# Patient Record
Sex: Male | Born: 1945 | Race: Black or African American | Hispanic: No | Marital: Married | Smoking: Former smoker
Health system: Southern US, Community
[De-identification: ages and names within clinical notes are randomized; demographics above are authoritative.]

## PROBLEM LIST (undated history)

## (undated) DIAGNOSIS — F32A Depression, unspecified: Secondary | ICD-10-CM

## (undated) DIAGNOSIS — N19 Unspecified kidney failure: Secondary | ICD-10-CM

## (undated) DIAGNOSIS — Z9889 Other specified postprocedural states: Secondary | ICD-10-CM

## (undated) DIAGNOSIS — I10 Essential (primary) hypertension: Secondary | ICD-10-CM

## (undated) DIAGNOSIS — O99342 Other mental disorders complicating pregnancy, second trimester: Secondary | ICD-10-CM

## (undated) DIAGNOSIS — I209 Angina pectoris, unspecified: Secondary | ICD-10-CM

## (undated) DIAGNOSIS — M1990 Unspecified osteoarthritis, unspecified site: Secondary | ICD-10-CM

## (undated) DIAGNOSIS — J189 Pneumonia, unspecified organism: Secondary | ICD-10-CM

## (undated) DIAGNOSIS — I499 Cardiac arrhythmia, unspecified: Secondary | ICD-10-CM

## (undated) DIAGNOSIS — I219 Acute myocardial infarction, unspecified: Secondary | ICD-10-CM

## (undated) DIAGNOSIS — R112 Nausea with vomiting, unspecified: Secondary | ICD-10-CM

## (undated) DIAGNOSIS — F329 Major depressive disorder, single episode, unspecified: Secondary | ICD-10-CM

## (undated) DIAGNOSIS — Z992 Dependence on renal dialysis: Secondary | ICD-10-CM

## (undated) DIAGNOSIS — G4730 Sleep apnea, unspecified: Secondary | ICD-10-CM

## (undated) DIAGNOSIS — M549 Dorsalgia, unspecified: Secondary | ICD-10-CM

## (undated) HISTORY — PX: COLONOSCOPY W/ BIOPSIES AND POLYPECTOMY: SHX1376

## (undated) HISTORY — PX: EYE SURGERY: SHX253

## (undated) HISTORY — PX: BACK SURGERY: SHX140

## (undated) HISTORY — PX: AV FISTULA PLACEMENT: SHX1204

---

## 1997-02-09 ENCOUNTER — Encounter: Payer: Self-pay | Admitting: Pulmonary Disease

## 1997-03-11 ENCOUNTER — Encounter: Payer: Self-pay | Admitting: Pulmonary Disease

## 1998-06-29 ENCOUNTER — Encounter (HOSPITAL_COMMUNITY): Admission: RE | Admit: 1998-06-29 | Discharge: 1998-09-27 | Payer: Self-pay | Admitting: Nephrology

## 1998-09-30 ENCOUNTER — Encounter (HOSPITAL_COMMUNITY): Admission: RE | Admit: 1998-09-30 | Discharge: 1998-12-29 | Payer: Self-pay | Admitting: Nephrology

## 1999-01-06 ENCOUNTER — Encounter (HOSPITAL_COMMUNITY): Admission: RE | Admit: 1999-01-06 | Discharge: 1999-04-06 | Payer: Self-pay | Admitting: Nephrology

## 1999-04-19 ENCOUNTER — Encounter (HOSPITAL_COMMUNITY): Admission: RE | Admit: 1999-04-19 | Discharge: 1999-07-18 | Payer: Self-pay | Admitting: Nephrology

## 1999-07-21 ENCOUNTER — Encounter: Admission: RE | Admit: 1999-07-21 | Discharge: 1999-07-21 | Payer: Self-pay | Admitting: Nephrology

## 1999-07-21 ENCOUNTER — Encounter: Payer: Self-pay | Admitting: Nephrology

## 1999-07-21 ENCOUNTER — Encounter (HOSPITAL_COMMUNITY): Admission: RE | Admit: 1999-07-21 | Discharge: 1999-10-19 | Payer: Self-pay | Admitting: Nephrology

## 1999-11-09 ENCOUNTER — Encounter (HOSPITAL_COMMUNITY): Admission: RE | Admit: 1999-11-09 | Discharge: 2000-02-07 | Payer: Self-pay | Admitting: Critical Care Medicine

## 2000-02-08 ENCOUNTER — Encounter (HOSPITAL_COMMUNITY): Admission: RE | Admit: 2000-02-08 | Discharge: 2000-05-08 | Payer: Self-pay | Admitting: Nephrology

## 2003-02-24 ENCOUNTER — Ambulatory Visit (HOSPITAL_COMMUNITY): Admission: RE | Admit: 2003-02-24 | Discharge: 2003-02-24 | Payer: Self-pay | Admitting: Orthopedic Surgery

## 2003-03-06 ENCOUNTER — Encounter: Admission: RE | Admit: 2003-03-06 | Discharge: 2003-06-04 | Payer: Self-pay | Admitting: Orthopedic Surgery

## 2003-06-05 ENCOUNTER — Encounter: Admission: RE | Admit: 2003-06-05 | Discharge: 2003-07-02 | Payer: Self-pay | Admitting: Orthopedic Surgery

## 2004-06-02 ENCOUNTER — Encounter (HOSPITAL_COMMUNITY): Admission: RE | Admit: 2004-06-02 | Discharge: 2004-08-31 | Payer: Self-pay | Admitting: Nephrology

## 2004-09-15 ENCOUNTER — Encounter (HOSPITAL_COMMUNITY): Admission: RE | Admit: 2004-09-15 | Discharge: 2004-12-14 | Payer: Self-pay | Admitting: Nephrology

## 2005-01-19 ENCOUNTER — Encounter (HOSPITAL_COMMUNITY): Admission: RE | Admit: 2005-01-19 | Discharge: 2005-04-19 | Payer: Self-pay | Admitting: Nephrology

## 2005-04-27 ENCOUNTER — Encounter (HOSPITAL_COMMUNITY): Admission: RE | Admit: 2005-04-27 | Discharge: 2005-07-26 | Payer: Self-pay | Admitting: Nephrology

## 2005-04-28 ENCOUNTER — Ambulatory Visit (HOSPITAL_COMMUNITY): Admission: RE | Admit: 2005-04-28 | Discharge: 2005-04-28 | Payer: Self-pay | Admitting: Vascular Surgery

## 2005-08-10 ENCOUNTER — Encounter (HOSPITAL_COMMUNITY): Admission: RE | Admit: 2005-08-10 | Discharge: 2005-11-08 | Payer: Self-pay | Admitting: Nephrology

## 2005-11-23 ENCOUNTER — Encounter (HOSPITAL_COMMUNITY): Admission: RE | Admit: 2005-11-23 | Discharge: 2006-02-21 | Payer: Self-pay | Admitting: Nephrology

## 2006-03-01 ENCOUNTER — Encounter (HOSPITAL_COMMUNITY): Admission: RE | Admit: 2006-03-01 | Discharge: 2006-05-30 | Payer: Self-pay | Admitting: Nephrology

## 2006-05-31 ENCOUNTER — Ambulatory Visit: Payer: Self-pay | Admitting: Vascular Surgery

## 2006-05-31 ENCOUNTER — Ambulatory Visit (HOSPITAL_COMMUNITY): Admission: RE | Admit: 2006-05-31 | Discharge: 2006-05-31 | Payer: Self-pay | Admitting: Vascular Surgery

## 2006-08-02 ENCOUNTER — Encounter: Admission: RE | Admit: 2006-08-02 | Discharge: 2006-08-02 | Payer: Self-pay | Admitting: Nephrology

## 2006-08-11 ENCOUNTER — Encounter: Admission: RE | Admit: 2006-08-11 | Discharge: 2006-08-11 | Payer: Self-pay | Admitting: Nephrology

## 2006-10-23 ENCOUNTER — Ambulatory Visit (HOSPITAL_COMMUNITY): Admission: RE | Admit: 2006-10-23 | Discharge: 2006-10-23 | Payer: Self-pay | Admitting: Vascular Surgery

## 2006-10-23 ENCOUNTER — Ambulatory Visit: Payer: Self-pay | Admitting: Vascular Surgery

## 2006-11-10 ENCOUNTER — Ambulatory Visit (HOSPITAL_COMMUNITY): Admission: RE | Admit: 2006-11-10 | Discharge: 2006-11-10 | Payer: Self-pay | Admitting: Nephrology

## 2006-11-24 ENCOUNTER — Ambulatory Visit: Payer: Self-pay | Admitting: Vascular Surgery

## 2006-12-11 ENCOUNTER — Ambulatory Visit: Payer: Self-pay | Admitting: Vascular Surgery

## 2006-12-11 ENCOUNTER — Ambulatory Visit (HOSPITAL_COMMUNITY): Admission: RE | Admit: 2006-12-11 | Discharge: 2006-12-11 | Payer: Self-pay | Admitting: Vascular Surgery

## 2006-12-13 ENCOUNTER — Ambulatory Visit: Payer: Self-pay | Admitting: Vascular Surgery

## 2007-02-12 ENCOUNTER — Ambulatory Visit (HOSPITAL_COMMUNITY): Admission: RE | Admit: 2007-02-12 | Discharge: 2007-02-12 | Payer: Self-pay | Admitting: Vascular Surgery

## 2007-02-12 ENCOUNTER — Ambulatory Visit: Payer: Self-pay | Admitting: *Deleted

## 2007-05-04 ENCOUNTER — Ambulatory Visit: Payer: Self-pay | Admitting: Vascular Surgery

## 2007-06-13 ENCOUNTER — Ambulatory Visit: Payer: Self-pay | Admitting: Pulmonary Disease

## 2007-06-13 DIAGNOSIS — I499 Cardiac arrhythmia, unspecified: Secondary | ICD-10-CM | POA: Insufficient documentation

## 2007-06-13 DIAGNOSIS — N186 End stage renal disease: Secondary | ICD-10-CM

## 2007-06-13 DIAGNOSIS — G4730 Sleep apnea, unspecified: Secondary | ICD-10-CM | POA: Insufficient documentation

## 2007-06-13 DIAGNOSIS — E119 Type 2 diabetes mellitus without complications: Secondary | ICD-10-CM | POA: Insufficient documentation

## 2007-06-13 DIAGNOSIS — I10 Essential (primary) hypertension: Secondary | ICD-10-CM | POA: Insufficient documentation

## 2007-06-13 DIAGNOSIS — E785 Hyperlipidemia, unspecified: Secondary | ICD-10-CM

## 2007-06-14 ENCOUNTER — Telehealth: Payer: Self-pay | Admitting: Pulmonary Disease

## 2007-08-16 ENCOUNTER — Telehealth (INDEPENDENT_AMBULATORY_CARE_PROVIDER_SITE_OTHER): Payer: Self-pay | Admitting: *Deleted

## 2007-08-23 ENCOUNTER — Encounter (INDEPENDENT_AMBULATORY_CARE_PROVIDER_SITE_OTHER): Payer: Self-pay | Admitting: *Deleted

## 2007-08-24 ENCOUNTER — Telehealth (INDEPENDENT_AMBULATORY_CARE_PROVIDER_SITE_OTHER): Payer: Self-pay | Admitting: *Deleted

## 2007-08-29 ENCOUNTER — Ambulatory Visit (HOSPITAL_COMMUNITY): Admission: RE | Admit: 2007-08-29 | Discharge: 2007-08-29 | Payer: Self-pay | Admitting: Nephrology

## 2010-04-11 ENCOUNTER — Encounter: Payer: Self-pay | Admitting: Vascular Surgery

## 2010-08-03 NOTE — Op Note (Signed)
NAME:  Jeffrey Manning, Jeffrey Manning              ACCOUNT NO.:  [id number]   MEDICAL RECORD NO.:  [id number]          PATIENT TYPE:  AMB   LOCATION:  SDS                          FACILITY:  [facility]   PHYSICIAN:  Larina Earthly, M.D.    DATE OF BIRTH:  [birth year]   DATE OF PROCEDURE:  DATE OF DISCHARGE:                               OPERATIVE REPORT   PREOPERATIVE DIAGNOSIS:  End-stage renal disease with poorly functioning  left upper arm arteriovenous fistula due to deep subcutaneous course of  the fistula.   POSTOPERATIVE DIAGNOSIS:  End-stage renal disease with poorly  functioning left upper arm arteriovenous fistula due to deep  subcutaneous course of the fistula.   PROCEDURE:  1. Revision with resection of underlying fat and fascia over the left      upper arm AV fistula.  2. Placement of left IJ Diatek catheter with ultrasound visualization.   SURGEON:  Larina Earthly, M.D.   ASSISTANT:  Nurse.   ANESTHESIA:  MAC.   COMPLICATIONS:  None.   DISPOSITION:  To recovery room with chest x-ray pending.   DESCRIPTION OF PROCEDURE:  The patient was taken to the operating room  and placed in the supine position where the area of the left arm was  prepped and draped in the usual sterile fashion using local anesthesia.   Two separate incisions were made just medial to the upper arm fistula,  and through these incisions of the subcutaneous tissue and fascia over  the large cephalic vein fistula were resected.  This was resected for an  approximately 3-cm area over the area of the fistula itself.  The wounds  were irrigated with saline and hemostasis obtained with electrocautery.  A 10 flat Jackson-Pratt drain was brought through a separate stab  incision and was placed in the subcutaneous tunnel over the cephalic  vein fistula.  This was secured with 3-0 nylon stitch.  The two  incisions used to resect the fat and fascia were closed with 3-0 Vicryl  in the subcutaneous and subcuticular tissue.   Benzoin and Steri-Strips  were applied.   Next, the right and left neck and chest were imaged with ultrasound.  The patient had a small and possibly occluded right internal jugular  vein.  The left jugular vein was widely patent.  For this reason,  decision was made to place a left Diatek catheter.  The patient was  placed into Trendelenburg position and the right and left neck and chest  prepped and draped in the usual sterile fashion using local anesthesia.  Using a finder needle, the left internal jugular vein was identified.  Next, using Seldinger technique, a guidewire was passed down through an  18-gauge needle, and this was confirmed with fluoroscopy.  This was  passed down into the vena cava.  A dilator and peel-away sheath was  passed over the guidewire, and a 32-cm Diatek catheter was passed  through the peel-away sheath which was removed.  The catheter was  brought through a subcutaneous tunnel through a separate stab incision,  and the two lumen ports were attached.  Both lumens  flushed and  aspirated easily and were locked with 1000 units per mL of heparin.  The  catheter was secured to the skin with 3-0 nylon stitch and the entry  sites closed for 4-0 subcuticular Vicryl stitch.  Sterile dressing was  applied.   The patient was taken to the recovery in stable condition.      Larina Earthly, M.D.  Electronically Signed     TFE/MEDQ  D:  12/11/2006  T:  12/11/2006  Job:  [id number]

## 2010-08-03 NOTE — Assessment & Plan Note (Signed)
OFFICE VISIT   Jeffrey Manning, Jeffrey Manning  DOB:  [birth year]                                       11/24/2006  HYQMV#:[id number]   OFFICE NOTE   The patient presents today for evaluation of AV access.  He is a 65-year-  old black male with end-stage renal disease.  He has a left upper arm AV  fistula creation by me in February of 2007.  He had a Diatek catheter  placed after an infiltration in March of 2008.  He recently had the  catheter removed.  He has undergone a shuntogram of his left upper arm  AV fistula, but is still having difficulty accessing this.   PHYSICAL EXAMINATION:  He does have a very well-developed large cephalic  vein fistula in his upper arm.  It is relatively superficial near the  antecubital space and this is the only area that the dialysis access  staff is able to access this fistula.  I imaged this with ultrasound and  reviewed his shuntogram.  He does have a few small insignificant side  branches off the cephalic vein fistula.  His issue is that the vein runs  below the fascia and that it is deep throughout the upper arm.  I  discussed options at length with the patient.  I explained one option  would be to abandon the fistula and placing a prosthetic graft.  Another  option would be to attempt to revive the fistula with mobilization and  removal of the subcutaneous fat and fascia above the vein.  I explained  that we would have to place a Diatek  catheter at the same setting to  allow this area to heal for approximately one month to six weeks and  then begin initiation of access of the fistula again.  He wishes to  proceed with this route.  I offered to proceed on 11/27/2006 and he  wishes  to defer this for two weeks.  We will proceed with the procedure on  12/11/2006.  He will notify us here if he develops access difficulty in  the interim.   Larina Earthly, M.D.  Electronically Signed   TFE/MEDQ  D:  11/24/2006  T:  11/27/2006   Job:  [id number]   cc:   Margaree Mackintosh, M.D.

## 2010-08-03 NOTE — Assessment & Plan Note (Signed)
OFFICE VISIT   TRACI, PLEMONS  DOB:  08-16-1945                                       05/04/2007  CHART#:[id number]   The patient presents today for evaluation of lower extremity  claudication.  He is known to me from prior AV access creation.  He is a  65 year old black male with end-stage renal disease.  He has been on  dialysis for over 2 years.  He reports numbness and burning in both legs  from his hips distally with walking.  He denies any resting symptoms.  Specifically, no problems with standing, squatting, or lying down, or  sitting.  He reports that the discomfort is relieved after several  minutes of rest.  He denies any rest pain in his lower extremities.   PAST MEDICAL HISTORY:  Significant for diabetes, hypertension.  He quit  smoking 15 years ago.  Does have end-stage renal disease.  He has a left  upper arm fistula for dialysis.   PHYSICAL EXAM:  Blood pressure 154/69, pulse 104, respirations 18, O2  saturation 97% on room air.  He does have 2+ radial pulses, 1 to 2+  femoral pulses.  I do not palpate pedal pulses bilaterally.   He underwent noninvasive vascular laboratory studies in our office.  This revealed slightly diminished pressure on the right at 0.89, normal  pressures on the left at 1.1.  He underwent a walking exercise study,  and this did not show any dramatic drop.  He did have a slight drop on  the left, no significant drop on the right.  I explained to the patient  that his symptoms certainly go along with lower extremity claudication-  type symptoms.  I have recommended CT angiogram for further evaluation  to determine if he does have significant iliac occlusive disease.  If  so, we would schedule him for formal arteriography and angioplasty.  I  will see him with his CT angiogram in 2 weeks when this is accomplished.   Larina Earthly, M.D.  Electronically Signed   TFE/MEDQ  D:  05/04/2007  T:  05/07/2007  Job:  [id number]

## 2010-08-03 NOTE — Assessment & Plan Note (Signed)
OFFICE VISIT   TRA, WILEMON  DOB:  21-Dec-1945                                       12/13/2006  CHART#:[id number]   Patient underwent revision of his left upper arm AV fistula on September  22.  He has a very well-developed upper arm fistula but was quite deep  down in his subcutaneous fat and fascia.  I mobilized this through two  separate incisions in his upper arm.  I did place a Jackson-Pratt drain,  which I have removed today.  He does have excellent initial result with  superficial lying vein that should be accessible for access in one  month.  He will continue the use of his hemodialysis catheter in the  interim.   Larina Earthly, M.D.  Electronically Signed   TFE/MEDQ  D:  12/13/2006  T:  12/14/2006  Job:  [id number]   cc:   Fayrene Fearing L. Deterding, M.D.

## 2010-08-06 NOTE — Op Note (Signed)
NAME:  Jeffrey Manning, Jeffrey Manning                        ACCOUNT NO.:  [id number]   MEDICAL RECORD NO.:  [id number]                   PATIENT TYPE:  AMB   LOCATION:  DAY                                  FACILITY:  [facility]   PHYSICIAN:  Almedia Balls. Ranell Patrick, M.D.              DATE OF BIRTH:  1945-12-08   DATE OF PROCEDURE:  02/24/2003  DATE OF DISCHARGE:                                 OPERATIVE REPORT   PREOPERATIVE DIAGNOSES:  Left shoulder partial-thickness rotator cuff tear,  chronic impingement syndrome, and acromioclavicular joint arthritis.   POSTOPERATIVE DIAGNOSES:  1. Left shoulder partial-thickness rotator cuff tear (near full-thickness).  2. Chronic impingement syndrome.  3. Acromioclavicular joint arthritis.   PROCEDURES PERFORMED:  1. Left shoulder arthroscopy with arthroscopic subacromial decompression.  2. Mini-open rotator cuff repair.  3. Open distal clavicle excision.   ATTENDING SURGEON:  Almedia Balls. Ranell Patrick, M.D.   FIRST ASSISTANT:  Clarene Reamer, P.A.-C.   SECOND ASSISTANT:  Nance Pew, P.A.-S.   ANESTHESIA:  General anesthesia with interscalene block anesthesia used.   ESTIMATED BLOOD LOSS:  Minimal   FLUIDS REPLACED:  1200 mL crystalloid.   Instrument counts were correct. There were no complications.  Preoperative  antibiotics were given.   INDICATIONS:  The patient is a 65 year old male who presents complaining of  left shoulder pain following an on-the-job injury.  The patient has had  extensive preoperative workup and testing and repeat clinical examination  with MRI scans demonstrating partial-thickness rotator cuff tear.  The  patient has failed conservative treatment regimen consisting of activity  modification, physical therapy, anti-inflammatory medications, and  injections, and presents now for operative treatment of suspected rotator  cuff tear and AC joint arthrosis.   After an adequate level of anesthesia achieved, patient positioned in the  modified beach chair position and the left shoulder was examined under  anesthesia.  Forward flexion to 140 degrees abduction, 90 degrees external  rotation, 30 internal rotation as outlined.  Negative sulcus.  Negative  anterior and posterior drawer.  After completion of exam under anesthesia,  the left shoulder was prepped and draped in its entirety in the usual  sterile fashion.  Diagnostic arthroscopy was carried out with standard  arthroscopic portals anteriorly and posteriorly.  These were created in a  similar fashion with infiltration of the skin with 0.25% Marcaine with  epinephrine followed by incision with 11 blade scalpel and introduction of  the cannula into the joint using blunt obturators.  Diagnostic arthroscopy  revealed a normal superior labrum and biceps anchor.  The biceps was normal.  Anterior inferior labrum was normal.  Subscapularis normal.  The rotator  cuff is torn in the region of the supraspinatus involving nearly the full  footprint with a [place] bit of tendon remaining. The teres minor and  infraspinatus were normal.  The posterior labrum was intact.  The  glenohumeral articular cartilage was fairly intact with some very  mild  chondromalacia anteriorly on the humeral head.  After this the scope was  placed in the subacromial space and a thorough bursectomy was performed  utilizing a full-radius resector followed by a very subtle acromioplasty.  The patient had a type 2 acromion but it was nice and smooth.  There were no  major spurs.  Just the very anterior lip was removed because the patient was  noted on preoperative MRI scan to have an os acromiale, which was a fairly  large one.  The CA ligament was also recessed but not completely removed.  Once the acromioplasty is performed, the scope is completed.  A small  incision was created overlying the junction between the anterior and lateral  heads of the deltoid for a mini-open repair.  This was taken down to  the  deltoid, which was split in line with its fibers and the raphe, exposing the  rotator cuff tear, which was easily identifiable.  A knife was used to  remove unhealthy-appearing rotator cuff tissue.  The footprint was freshened  up.  The remaining cuff looked outstanding.  Thus a single biocorkscrew  anchor 5-0 by Arthrex was utilized at the footprint, and it was repaired  with two Fibrewire sutures in a mattress fashion and a nice, smooth, low-  profile repair was obtained.  The shoulder was taken through a full range of  motion and no tension was noted.  The acromion was stable at the end of this  procedure.  After this a small sabre incision was created overlying the  distal clavicle at the [facility] joint just medial to that.  Dissection was carried  sharply down through the subcutaneous tissues.  The deltotrapezial fascia  was incised in line with the distal clavicle using a needle-tip Bovie.  Dissection was carried sharply down in the periosteal plane around the  distal end of the clavicle.  The distal 1 cm was excised using the  oscillating saw.  Thorough irrigation was performed to remove bony debris.  A small amount of bone wax at the distal end of the clavicle.  The  deltotrapezial fascia was repaired using #1 Vicryl suture in interrupted  figure-of-eight fashion with buried knots, followed by 2-0 Vicryl subcu and  4-0 running Monocryl for skin, and the other skin incisions as well as the  portals.  The patient was placed in a shoulder sling, having tolerated the  surgery well.                                               Almedia Balls. Ranell Patrick, M.D.    SRN/MEDQ  D:  02/24/2003  T:  02/25/2003  Job:  [id number]

## 2010-08-06 NOTE — Op Note (Signed)
[name], Jeffrey Manning              ACCOUNT NO.:  [id number]   MEDICAL RECORD NO.:  [id number]          PATIENT TYPE:  AMB   LOCATION:  SDS                          FACILITY:  [facility]   PHYSICIAN:  Larina Earthly, M.D.    DATE OF BIRTH:  04/04/1945   DATE OF PROCEDURE:  04/28/2005  DATE OF DISCHARGE:                                 OPERATIVE REPORT   PREOPERATIVE DIAGNOSIS:  Chronic renal insufficiency.   POSTOPERATIVE DIAGNOSIS:  Chronic renal insufficiency.   PROCEDURE:  Creation of left upper arm arteriovenous fistula.   SURGEON:  Larina Earthly, M.D.   ASSISTANT:  Coral Ceo, P.A.-C.   ANESTHESIA:  Monitored anesthesia care.   COMPLICATIONS:  None.   DISPOSITION:  To the recovery room stable.   PROCEDURE IN DETAIL:  The patient was taken to the operating room, placed in  the supine position, and the area of the left arm prepped and draped in the  usual sterile fashion.  Using local anesthesia, an incision was made over  the antecubital space and carried down to isolate the cephalic vein which  was of good caliber and the brachial artery which was also of good caliber.  The branches of the cephalic vein were ligated and divided.  The vein was  divided distally and mobilized to above the brachial artery.  The brachial  artery was occluded proximally and distally, opened with an 11 blade,  extended with Potts scissors.  The vein was spatulated and sewn end-to-side  to the artery with running 6-0 Prolene suture.  The clamps were removed and  an excellent thrill was noted.  The wound was irrigated with saline.  Hemostasis was obtained with cautery.  The wounds were closed with 3-0  Vicryl in the subcutaneous and subcuticular tissue.  Benzoin and Steri-  Strips were applied.      Larina Earthly, M.D.  Electronically Signed     TFE/MEDQ  D:  04/28/2005  T:  04/28/2005  Job:  [id number]

## 2010-08-06 NOTE — Op Note (Signed)
NAME:  PERNELL, LENOIR              ACCOUNT NO.:  [id number]   MEDICAL RECORD NO.:  [id number]          PATIENT TYPE:  AMB   LOCATION:  SDS                          FACILITY:  [facility]   PHYSICIAN:  Sanders E. Fields, MD  DATE OF BIRTH:  [birth year]   DATE OF PROCEDURE:  05/31/2006  DATE OF DISCHARGE:  05/31/2006                               OPERATIVE REPORT   PROCEDURES:  1. Insertion of right internal jugular vein Diatek catheter.  2. Head and neck ultrasound.   PREOPERATIVE DIAGNOSIS:  End-stage renal disease.   POSTOPERATIVE DIAGNOSIS:  End-stage renal disease.   ANESTHESIA:  Local with IV sedation.   INDICATIONS:  The patient has an existing left brachiocephalic fistula  which had a infiltration and could not currently be cannulated.  He  needs long-term dialysis access in the meanwhile.   OPERATIVE FINDINGS:  1. A 23 cm Diatek catheter right internal jugular vein.   OPERATIVE DETAILS:  After obtaining informed consent, the patient was  taken to the operating.  The patient placed supine position on the  operating table.  After adequate sedation, the patient's neck was  inspected with an ultrasound.  Right and left internal jugular veins  found  to be widely patent and with normal compressibility and  respiratory variation.  Next the patient's entire neck and chest were  prepped and draped in the usual sterile fashion.  A small finder needle  was used to localize the right internal jugular vein after  administration of local anesthesia.  A larger introducer needle was then  used to cannulate the right internal jugular vein.  A 0.035 J-tip  guidewire was then threaded through the needle into the inferior vena  cava under fluoroscopic guidance.  Next sequential 12, 14 16-French  dilators with peel-away sheath were placed over the guidewire in the  right atrium.  Guidewire and dilator was removed and a 23 cm Diatek  catheter threaded through the peel-away sheath down in the  right atrium.  Catheter was then tunneled subcutaneously, cut to length and the hub  attached.  Catheter was inspected under fluoroscopy and found with its  tip to be in the right atrium, no kinks throughout its course.  Catheter  was also noted to flush and draw easily.  Catheter was sutured to skin  with nylon sutures.  Neck insertion site was closed with Vicryl stitch.  The patient tolerated procedure well and there were no complications.  Instrument, sponge and needle count was correct at the end of the case.  The patient taken to the recovery room in stable condition.  Catheter  was loaded concentrated heparin solution prior to taking the patient to  the recovery room.      Janetta Hora. Fields, MD  Electronically Signed    CEF/MEDQ  D:  05/31/2006  T:  06/02/2006  Job:  [id number]

## 2010-12-06 ENCOUNTER — Other Ambulatory Visit: Payer: Self-pay | Admitting: Orthopedic Surgery

## 2010-12-06 DIAGNOSIS — M79604 Pain in right leg: Secondary | ICD-10-CM

## 2010-12-12 ENCOUNTER — Ambulatory Visit
Admission: RE | Admit: 2010-12-12 | Discharge: 2010-12-12 | Disposition: A | Discharge status: Home / Self Care | Payer: BLUE CROSS/BLUE SHIELD | Source: Ambulatory Visit | Attending: Orthopedic Surgery | Admitting: Orthopedic Surgery

## 2010-12-12 DIAGNOSIS — M79604 Pain in right leg: Secondary | ICD-10-CM

## 2010-12-23 ENCOUNTER — Other Ambulatory Visit: Payer: Self-pay | Admitting: Orthopedic Surgery

## 2010-12-23 DIAGNOSIS — M869 Osteomyelitis, unspecified: Secondary | ICD-10-CM

## 2010-12-25 ENCOUNTER — Encounter: Payer: Self-pay | Admitting: Emergency Medicine

## 2010-12-25 ENCOUNTER — Emergency Department (HOSPITAL_BASED_OUTPATIENT_CLINIC_OR_DEPARTMENT_OTHER)
Admission: EM | Admit: 2010-12-25 | Discharge: 2010-12-25 | Disposition: A | Discharge status: Home / Self Care | Payer: Medicare Other | Attending: Emergency Medicine | Admitting: Emergency Medicine

## 2010-12-25 DIAGNOSIS — N289 Disorder of kidney and ureter, unspecified: Secondary | ICD-10-CM | POA: Insufficient documentation

## 2010-12-25 DIAGNOSIS — I10 Essential (primary) hypertension: Secondary | ICD-10-CM | POA: Insufficient documentation

## 2010-12-25 DIAGNOSIS — M549 Dorsalgia, unspecified: Secondary | ICD-10-CM | POA: Insufficient documentation

## 2010-12-25 DIAGNOSIS — E119 Type 2 diabetes mellitus without complications: Secondary | ICD-10-CM | POA: Insufficient documentation

## 2010-12-25 DIAGNOSIS — Z79899 Other long term (current) drug therapy: Secondary | ICD-10-CM | POA: Insufficient documentation

## 2010-12-25 HISTORY — DX: Unspecified kidney failure: N19

## 2010-12-25 HISTORY — DX: Essential (primary) hypertension: I10

## 2010-12-25 HISTORY — DX: Dependence on renal dialysis: Z99.2

## 2010-12-25 HISTORY — DX: Dorsalgia, unspecified: M54.9

## 2010-12-25 MED ORDER — OXYCODONE-ACETAMINOPHEN 10-325 MG PO TABS: 1.0000 | ORAL_TABLET | Freq: Four times a day (QID) | ORAL | Status: AC | PRN

## 2010-12-25 MED ORDER — DEXAMETHASONE SODIUM PHOSPHATE 10 MG/ML IJ SOLN
10.0000 mg | Freq: Once | INTRAMUSCULAR | Status: AC
Start: 2010-12-25 — End: 2010-12-25
  Administered 2010-12-25: 10 mg via INTRAMUSCULAR

## 2010-12-25 MED ORDER — HYDROMORPHONE HCL 2 MG/ML IJ SOLN
INTRAMUSCULAR | Status: AC
  Filled 2010-12-25: qty 1

## 2010-12-25 MED ORDER — DEXAMETHASONE SODIUM PHOSPHATE 10 MG/ML IJ SOLN
INTRAMUSCULAR | Status: AC
  Filled 2010-12-25: qty 1

## 2010-12-25 MED ORDER — HYDROMORPHONE HCL 2 MG/ML IJ SOLN
2.0000 mg | Freq: Once | INTRAMUSCULAR | Status: AC
  Administered 2010-12-25: 2 mg via INTRAMUSCULAR

## 2010-12-25 NOTE — ED Provider Notes (Signed)
History     CSN: [id number] Arrival date & time: 12/25/2010  6:11 AM  Chief Complaint  Patient presents with  . Back Pain    (Consider location/radiation/quality/duration/timing/severity/associated sxs/prior treatment) HPI Comments: Patient is a 65 year old male who presents with back pain. He states his back pain is constant, ongoing for several months and has been evaluated thoroughly by his orthopedic physician Dr. Yevette Edwards.  He was told last month he had an infection in his back and has been scheduled to have further testing. He denies numbness, weakness, urinary incontinence, saddle anesthesias, fevers. He states that this back pain has mild relief with oxycodone 5 mg tablets but the pain is persistent. It is worse he is sitting up, better when he is laying down. This is the same pain that he has been having for months.  Patient is a 65 y.o. male presenting with back pain. The history is provided by the patient, the spouse and medical records.  Back Pain  Pertinent negatives include no fever, no numbness and no weakness.    Past Medical History  Diagnosis Date  . Hypertension   . Diabetes mellitus   . Renal failure   . Dialysis patient   . Back pain     Past Surgical History  Procedure Date  . Av fistula placement     No family history on file.  History  Substance Use Topics  . Smoking status: Never Smoker   . Smokeless tobacco: Not on file  . Alcohol Use: No      Review of Systems  Constitutional: Negative for fever and chills.  HENT: Negative for neck pain.   Gastrointestinal: Negative for nausea, vomiting and diarrhea.  Genitourinary: Negative for difficulty urinating.  Musculoskeletal: Positive for back pain.  Skin: Negative for rash.  Neurological: Negative for weakness and numbness.    Allergies  Review of patient's allergies indicates no known allergies.  Home Medications   Current Outpatient Rx  Name Route Sig Dispense Refill  . ALLOPURINOL 300  MG PO TABS Oral Take 300 mg by mouth at bedtime.      . ASPIRIN 81 MG PO TABS Oral Take 81 mg by mouth daily.      Marland Kitchen CALCIUM ACETATE (PHOS BINDER) 667 MG/5ML PO SOLN Oral Take 667 mg by mouth 3 (three) times daily with meals.      Marland Kitchen CINACALCET HCL 30 MG PO TABS Oral Take 30 mg by mouth daily.      Marland Kitchen DIALYVITE 3000 3 MG PO TABS Oral Take 1 tablet by mouth daily.      Marland Kitchen GLIPIZIDE 5 MG PO TB24 Oral Take 5 mg by mouth daily.      Marland Kitchen LANSOPRAZOLE 30 MG PO CPDR Oral Take 30 mg by mouth daily.      . OXYCODONE-ACETAMINOPHEN 5-325 MG PO TABS Oral Take 1 tablet by mouth every 6 (six) hours as needed.      . OXYCODONE-ACETAMINOPHEN 10-325 MG PO TABS Oral Take 1 tablet by mouth every 6 (six) hours as needed for pain. 30 tablet 0    BP 133/49  Pulse 84  Temp(Src) 98.7 F (37.1 C) (Oral)  Resp 18  SpO2 94%  Physical Exam  Constitutional: He appears well-developed and well-nourished. No distress.  HENT:  Head: Normocephalic and atraumatic.  Mouth/Throat: Oropharynx is clear and moist. No oropharyngeal exudate.  Eyes: Conjunctivae are normal. Right eye exhibits no discharge. Left eye exhibits no discharge. No scleral icterus.  Cardiovascular: Normal rate, regular rhythm, normal  heart sounds and intact distal pulses.   Pulmonary/Chest: Effort normal and breath sounds normal. No respiratory distress. He has no wheezes. He has no rales.  Abdominal: Soft. There is no tenderness.  Musculoskeletal: Normal range of motion. He exhibits no edema and no tenderness.       No tenderness over the mid spinal area, no paraspinal tenderness of the lumbar spine.  Neurological:       Normal motor and sensation to the legs bilaterally, no strength deficits, no focal weakness. Ambulates with minimal difficulty  Skin: Skin is warm and dry. No rash noted. He is not diaphoretic. No erythema.    ED Course  Procedures (including critical care time)  Labs Reviewed - No data to display No results found.   1. Back pain        MDM  I have reviewed the patient's medical record including an MRI that shows a possible discitis of his lower back. He is scheduled to follow up very closely with his orthopedic doctor and states that we do more testing. He has no fever and no neurologic symptoms at this time. I have sat down and discussed with patient and wife the indications for return including weakness, numbness, saddle anesthesia, urinary problems. He has expressed his understanding. Intramuscular Dilantin and Decadron given in the emergency department. I have encouraged him to followup with his orthopedic doctor within the next 2 days for a repeat evaluation.        Vida Roller, MD 12/25/10 [phone]

## 2010-12-25 NOTE — ED Notes (Signed)
Pt c/o lower back pain radiating down legs bilaterally. Pt has chronic back pain and has had several MRI with known protruding disc.

## 2010-12-28 ENCOUNTER — Other Ambulatory Visit: Payer: BLUE CROSS/BLUE SHIELD

## 2010-12-30 LAB — POCT I-STAT 4, (NA,K, GLUC, HGB,HCT)
HCT: 38 — ABNORMAL LOW
Hemoglobin: 12.9 — ABNORMAL LOW
Operator id: 181601
Potassium: 4.3
Sodium: 136

## 2010-12-30 MED ORDER — FENTANYL CITRATE 0.05 MG/ML IJ SOLN
25.0000 ug | INTRAMUSCULAR | Status: DC | PRN
  Administered 2010-12-31: 50 ug via INTRAVENOUS

## 2010-12-30 MED ORDER — CEFAZOLIN SODIUM 1-5 GM-% IV SOLN: 1.0000 g | Freq: Once | INTRAVENOUS | Status: DC

## 2010-12-30 MED ORDER — KETOROLAC TROMETHAMINE 30 MG/ML IJ SOLN
30.0000 mg | Freq: Once | INTRAMUSCULAR | Status: AC
  Administered 2010-12-31: 30 mg via INTRAVENOUS

## 2010-12-30 MED ORDER — SODIUM CHLORIDE 0.9 % IV SOLN
Freq: Once | INTRAVENOUS | Status: AC
  Administered 2010-12-31: 08:00:00 via INTRAVENOUS

## 2010-12-30 MED ORDER — MIDAZOLAM HCL 2 MG/2ML IJ SOLN
1.0000 mg | INTRAMUSCULAR | Status: DC | PRN
  Administered 2010-12-31: 1 mg via INTRAVENOUS

## 2010-12-30 NOTE — Patient Instructions (Signed)
Disc Aspiration/Bone Biopsy Post Procedure Discharge Instructions ° °1. You may resume a regular diet and any medications that you routinely take (including pain medications). °2. No driving the day of procedure. °3. Upon discharge go home and rest for at least 4 hours.  You may use an ice pack as needed to injection sites on back. °4. Remove bandaids later today. ° ° ° °Please contact our office at [phone] for the following symptoms: ° °· Fever greater than 100 degrees °· Increased swelling, pain, or redness at injection site. ° ° °Thank you for visiting [facility].  °

## 2010-12-31 ENCOUNTER — Other Ambulatory Visit: Payer: Self-pay | Admitting: Diagnostic Radiology

## 2010-12-31 ENCOUNTER — Other Ambulatory Visit: Payer: Self-pay | Admitting: Orthopedic Surgery

## 2010-12-31 ENCOUNTER — Ambulatory Visit
Admission: RE | Admit: 2010-12-31 | Discharge: 2010-12-31 | Disposition: A | Discharge status: Home / Self Care | Payer: Medicare Other | Source: Ambulatory Visit | Attending: Orthopedic Surgery | Admitting: Orthopedic Surgery

## 2010-12-31 ENCOUNTER — Other Ambulatory Visit (HOSPITAL_COMMUNITY)
Admission: RE | Admit: 2010-12-31 | Discharge: 2010-12-31 | Disposition: A | Discharge status: Home / Self Care | Payer: Medicare Other | Source: Ambulatory Visit | Attending: Diagnostic Radiology | Admitting: Diagnostic Radiology

## 2010-12-31 DIAGNOSIS — M869 Osteomyelitis, unspecified: Secondary | ICD-10-CM | POA: Insufficient documentation

## 2010-12-31 DIAGNOSIS — I708 Atherosclerosis of other arteries: Secondary | ICD-10-CM | POA: Insufficient documentation

## 2010-12-31 DIAGNOSIS — M549 Dorsalgia, unspecified: Secondary | ICD-10-CM

## 2010-12-31 DIAGNOSIS — I700 Atherosclerosis of aorta: Secondary | ICD-10-CM | POA: Insufficient documentation

## 2010-12-31 MED ORDER — ONDANSETRON HCL 4 MG/2ML IJ SOLN: 4.0000 mg | Freq: Once | INTRAMUSCULAR | Status: DC

## 2010-12-31 MED ORDER — OXYCODONE-ACETAMINOPHEN 5-325 MG PO TABS
2.0000 | ORAL_TABLET | Freq: Once | ORAL | Status: AC
  Administered 2010-12-31: 2 via ORAL

## 2011-01-07 LAB — BODY FLUID CULTURE: Organism ID, Bacteria: NO GROWTH

## 2011-01-08 LAB — ANAEROBIC CULTURE: Gram Stain: NONE SEEN

## 2011-01-14 ENCOUNTER — Ambulatory Visit (HOSPITAL_COMMUNITY)
Admission: RE | Admit: 2011-01-14 | Discharge: 2011-01-14 | Disposition: A | Discharge status: Home / Self Care | Payer: Medicare Other | Source: Ambulatory Visit | Attending: Orthopedic Surgery | Admitting: Orthopedic Surgery

## 2011-01-14 ENCOUNTER — Other Ambulatory Visit (HOSPITAL_COMMUNITY): Payer: Self-pay | Admitting: Orthopedic Surgery

## 2011-01-14 ENCOUNTER — Encounter (HOSPITAL_COMMUNITY)
Admission: RE | Admit: 2011-01-14 | Discharge: 2011-01-14 | Disposition: A | Discharge status: Home / Self Care | Payer: Medicare Other | Source: Ambulatory Visit | Attending: Orthopedic Surgery | Admitting: Orthopedic Surgery

## 2011-01-14 DIAGNOSIS — Z01812 Encounter for preprocedural laboratory examination: Secondary | ICD-10-CM | POA: Insufficient documentation

## 2011-01-14 DIAGNOSIS — M79604 Pain in right leg: Secondary | ICD-10-CM

## 2011-01-14 DIAGNOSIS — Z01818 Encounter for other preprocedural examination: Secondary | ICD-10-CM | POA: Insufficient documentation

## 2011-01-14 DIAGNOSIS — Z01810 Encounter for preprocedural cardiovascular examination: Secondary | ICD-10-CM | POA: Insufficient documentation

## 2011-01-14 LAB — DIFFERENTIAL
Basophils Absolute: 0 10*3/uL (ref 0.0–0.1)
Basophils Relative: 1 % (ref 0–1)
Eosinophils Relative: 3 % (ref 0–5)
Lymphocytes Relative: 18 % (ref 12–46)
Monocytes Absolute: 0.3 10*3/uL (ref 0.1–1.0)
Neutro Abs: 3.4 10*3/uL (ref 1.7–7.7)

## 2011-01-14 LAB — COMPREHENSIVE METABOLIC PANEL
AST: 14 U/L (ref 0–37)
Albumin: 3.6 g/dL (ref 3.5–5.2)
BUN: 16 mg/dL (ref 6–23)
Calcium: 10.4 mg/dL (ref 8.4–10.5)
Creatinine, Ser: 5.14 mg/dL — ABNORMAL HIGH (ref 0.50–1.35)
Total Protein: 6.6 g/dL (ref 6.0–8.3)

## 2011-01-14 LAB — CBC
MCHC: 31.7 g/dL (ref 30.0–36.0)
RDW: 16 % — ABNORMAL HIGH (ref 11.5–15.5)
WBC: 4.7 10*3/uL (ref 4.0–10.5)

## 2011-01-14 LAB — TYPE AND SCREEN
ABO/RH(D): A NEG
Antibody Screen: NEGATIVE

## 2011-01-14 LAB — PROTIME-INR
INR: 1.15 (ref 0.00–1.49)
Prothrombin Time: 14.9 seconds (ref 11.6–15.2)

## 2011-01-14 LAB — SURGICAL PCR SCREEN
MRSA, PCR: NEGATIVE
Staphylococcus aureus: NEGATIVE

## 2011-01-14 LAB — APTT: aPTT: 37 seconds (ref 24–37)

## 2011-01-20 ENCOUNTER — Inpatient Hospital Stay (HOSPITAL_COMMUNITY): Admission: RE | Admit: 2011-01-20 | Payer: Medicare Other | Source: Ambulatory Visit | Admitting: Orthopedic Surgery

## 2011-01-26 ENCOUNTER — Other Ambulatory Visit: Payer: Self-pay | Admitting: Orthopedic Surgery

## 2011-02-01 ENCOUNTER — Encounter (HOSPITAL_COMMUNITY): Payer: Self-pay | Admitting: Pharmacy Technician

## 2011-02-02 ENCOUNTER — Encounter (HOSPITAL_COMMUNITY): Payer: Self-pay

## 2011-02-03 ENCOUNTER — Other Ambulatory Visit: Payer: Self-pay | Admitting: Orthopedic Surgery

## 2011-02-03 ENCOUNTER — Inpatient Hospital Stay (HOSPITAL_COMMUNITY)
Admission: RE | Admit: 2011-02-03 | Discharge: 2011-02-06 | DRG: 459 | Disposition: A | Discharge status: Home / Self Care | Payer: Medicare Other | Source: Ambulatory Visit | Attending: Orthopedic Surgery | Admitting: Orthopedic Surgery

## 2011-02-03 ENCOUNTER — Inpatient Hospital Stay (HOSPITAL_COMMUNITY): Payer: Medicare Other | Admitting: Certified Registered Nurse Anesthetist

## 2011-02-03 ENCOUNTER — Encounter (HOSPITAL_COMMUNITY): Payer: Self-pay | Admitting: *Deleted

## 2011-02-03 ENCOUNTER — Inpatient Hospital Stay (HOSPITAL_COMMUNITY): Payer: Medicare Other

## 2011-02-03 ENCOUNTER — Encounter (HOSPITAL_COMMUNITY): Payer: Self-pay | Admitting: Certified Registered Nurse Anesthetist

## 2011-02-03 ENCOUNTER — Encounter (HOSPITAL_COMMUNITY)
Admission: RE | Disposition: A | Discharge status: Home / Self Care | Payer: Self-pay | Source: Ambulatory Visit | Attending: Orthopedic Surgery

## 2011-02-03 DIAGNOSIS — N189 Chronic kidney disease, unspecified: Secondary | ICD-10-CM | POA: Diagnosis present

## 2011-02-03 DIAGNOSIS — M545 Low back pain, unspecified: Secondary | ICD-10-CM | POA: Diagnosis present

## 2011-02-03 DIAGNOSIS — D631 Anemia in chronic kidney disease: Secondary | ICD-10-CM | POA: Diagnosis present

## 2011-02-03 DIAGNOSIS — I120 Hypertensive chronic kidney disease with stage 5 chronic kidney disease or end stage renal disease: Secondary | ICD-10-CM | POA: Diagnosis present

## 2011-02-03 DIAGNOSIS — E119 Type 2 diabetes mellitus without complications: Secondary | ICD-10-CM | POA: Diagnosis present

## 2011-02-03 DIAGNOSIS — M48061 Spinal stenosis, lumbar region without neurogenic claudication: Secondary | ICD-10-CM | POA: Diagnosis present

## 2011-02-03 DIAGNOSIS — N039 Chronic nephritic syndrome with unspecified morphologic changes: Secondary | ICD-10-CM | POA: Diagnosis present

## 2011-02-03 DIAGNOSIS — M5137 Other intervertebral disc degeneration, lumbosacral region: Principal | ICD-10-CM | POA: Diagnosis present

## 2011-02-03 DIAGNOSIS — G4733 Obstructive sleep apnea (adult) (pediatric): Secondary | ICD-10-CM | POA: Diagnosis present

## 2011-02-03 DIAGNOSIS — I739 Peripheral vascular disease, unspecified: Secondary | ICD-10-CM | POA: Diagnosis present

## 2011-02-03 DIAGNOSIS — M899 Disorder of bone, unspecified: Secondary | ICD-10-CM | POA: Diagnosis present

## 2011-02-03 DIAGNOSIS — E669 Obesity, unspecified: Secondary | ICD-10-CM | POA: Insufficient documentation

## 2011-02-03 DIAGNOSIS — N186 End stage renal disease: Secondary | ICD-10-CM | POA: Diagnosis present

## 2011-02-03 DIAGNOSIS — E785 Hyperlipidemia, unspecified: Secondary | ICD-10-CM | POA: Diagnosis present

## 2011-02-03 DIAGNOSIS — M949 Disorder of cartilage, unspecified: Secondary | ICD-10-CM | POA: Diagnosis present

## 2011-02-03 DIAGNOSIS — M51379 Other intervertebral disc degeneration, lumbosacral region without mention of lumbar back pain or lower extremity pain: Principal | ICD-10-CM | POA: Diagnosis present

## 2011-02-03 HISTORY — DX: Unspecified osteoarthritis, unspecified site: M19.90

## 2011-02-03 HISTORY — DX: Angina pectoris, unspecified: I20.9

## 2011-02-03 HISTORY — DX: Cardiac arrhythmia, unspecified: I49.9

## 2011-02-03 HISTORY — DX: Sleep apnea, unspecified: G47.30

## 2011-02-03 LAB — CBC
HCT: 31.1 % — ABNORMAL LOW (ref 39.0–52.0)
HCT: 30.2 % — ABNORMAL LOW (ref 39.0–52.0)
Hemoglobin: 9.1 g/dL — ABNORMAL LOW (ref 13.0–17.0)
MCHC: 30.2 g/dL (ref 30.0–36.0)
MCV: 92.9 fL (ref 78.0–100.0)
Platelets: 197 10*3/uL (ref 150–400)
Platelets: 182 10*3/uL (ref 150–400)
RBC: 3.25 MIL/uL — ABNORMAL LOW (ref 4.22–5.81)
RDW: 16.7 % — ABNORMAL HIGH (ref 11.5–15.5)
WBC: 7.9 10*3/uL (ref 4.0–10.5)

## 2011-02-03 LAB — TYPE AND SCREEN: Antibody Screen: NEGATIVE

## 2011-02-03 LAB — BASIC METABOLIC PANEL
BUN: 14 mg/dL (ref 6–23)
Calcium: 9.9 mg/dL (ref 8.4–10.5)
Chloride: 102 mEq/L (ref 96–112)
Creatinine, Ser: 4.4 mg/dL — ABNORMAL HIGH (ref 0.50–1.35)
GFR calc Af Amer: 15 mL/min — ABNORMAL LOW (ref >90)
GFR calc non Af Amer: 13 mL/min — ABNORMAL LOW (ref >90)

## 2011-02-03 LAB — GLUCOSE, CAPILLARY
Glucose-Capillary: 164 mg/dL — ABNORMAL HIGH (ref 70–99)
Glucose-Capillary: 185 mg/dL — ABNORMAL HIGH (ref 70–99)

## 2011-02-03 LAB — RENAL FUNCTION PANEL
CO2: 30 mEq/L (ref 19–32)
Chloride: 101 mEq/L (ref 96–112)
Creatinine, Ser: 5.26 mg/dL — ABNORMAL HIGH (ref 0.50–1.35)
GFR calc non Af Amer: 10 mL/min — ABNORMAL LOW (ref >90)
Glucose, Bld: 177 mg/dL — ABNORMAL HIGH (ref 70–99)

## 2011-02-03 LAB — POCT I-STAT 4, (NA,K, GLUC, HGB,HCT): Glucose, Bld: 166 mg/dL — ABNORMAL HIGH (ref 70–99)

## 2011-02-03 SURGERY — POSTERIOR LUMBAR FUSION 1 LEVEL
Anesthesia: General | Site: Back | Laterality: Right | Wound class: Clean

## 2011-02-03 MED ORDER — CEFAZOLIN SODIUM 1-5 GM-% IV SOLN
1.0000 g | Freq: Three times a day (TID) | INTRAVENOUS | Status: AC
  Administered 2011-02-03 – 2011-02-04 (×2): 1 g via INTRAVENOUS
  Filled 2011-02-03 (×2): qty 50

## 2011-02-03 MED ORDER — CALCIUM ACETATE 667 MG PO CAPS
667.0000 mg | ORAL_CAPSULE | Freq: Three times a day (TID) | ORAL | Status: DC
  Administered 2011-02-04 – 2011-02-06 (×6): 667 mg via ORAL
  Filled 2011-02-03 (×10): qty 1

## 2011-02-03 MED ORDER — CEFAZOLIN SODIUM-DEXTROSE 2-3 GM-% IV SOLR
2.0000 g | Freq: Three times a day (TID) | INTRAVENOUS | Status: AC
  Filled 2011-02-03: qty 50

## 2011-02-03 MED ORDER — HYDROXYZINE HCL 50 MG/ML IM SOLN
50.0000 mg | INTRAMUSCULAR | Status: DC | PRN
  Filled 2011-02-03: qty 1

## 2011-02-03 MED ORDER — SODIUM CHLORIDE 0.9 % IR SOLN
Status: DC | PRN
  Administered 2011-02-03 (×3): 1

## 2011-02-03 MED ORDER — MAGNESIUM HYDROXIDE 400 MG/5ML PO SUSP: 30.0000 mL | Freq: Two times a day (BID) | ORAL | Status: DC | PRN

## 2011-02-03 MED ORDER — POTASSIUM CHLORIDE IN NACL 20-0.9 MEQ/L-% IV SOLN
INTRAVENOUS | Status: DC
  Administered 2011-02-03: 23:00:00 via INTRAVENOUS
  Filled 2011-02-03: qty 1000

## 2011-02-03 MED ORDER — ONDANSETRON HCL 4 MG/2ML IJ SOLN
4.0000 mg | Freq: Four times a day (QID) | INTRAMUSCULAR | Status: DC | PRN
  Filled 2011-02-03: qty 2

## 2011-02-03 MED ORDER — SODIUM CHLORIDE 0.9 % IV SOLN
INTRAVENOUS | Status: DC
  Administered 2011-02-03: 22:00:00 via INTRAVENOUS

## 2011-02-03 MED ORDER — NALOXONE HCL 0.4 MG/ML IJ SOLN
0.4000 mg | INTRAMUSCULAR | Status: DC | PRN
  Filled 2011-02-03: qty 1

## 2011-02-03 MED ORDER — DIALYVITE 3000 3 MG PO TABS: 1.0000 | ORAL_TABLET | Freq: Every day | ORAL | Status: DC

## 2011-02-03 MED ORDER — MENTHOL 3 MG MT LOZG: 1.0000 | LOZENGE | OROMUCOSAL | Status: DC | PRN

## 2011-02-03 MED ORDER — DIPHENHYDRAMINE HCL 50 MG/ML IJ SOLN
12.5000 mg | Freq: Four times a day (QID) | INTRAMUSCULAR | Status: DC | PRN
  Filled 2011-02-03: qty 0.25

## 2011-02-03 MED ORDER — ACETAMINOPHEN 325 MG PO TABS: 650.0000 mg | ORAL_TABLET | ORAL | Status: DC | PRN

## 2011-02-03 MED ORDER — ACETAMINOPHEN 650 MG RE SUPP: 650.0000 mg | RECTAL | Status: DC | PRN

## 2011-02-03 MED ORDER — FENTANYL CITRATE 0.05 MG/ML IJ SOLN
INTRAMUSCULAR | Status: DC | PRN
  Administered 2011-02-03 (×6): 50 ug via INTRAVENOUS
  Administered 2011-02-03: 100 ug via INTRAVENOUS
  Administered 2011-02-03 (×2): 50 ug via INTRAVENOUS

## 2011-02-03 MED ORDER — INSULIN ASPART 100 UNIT/ML ~~LOC~~ SOLN
0.0000 [IU] | SUBCUTANEOUS | Status: DC
  Administered 2011-02-03 – 2011-02-04 (×5): 3 [IU] via SUBCUTANEOUS
  Filled 2011-02-03: qty 3

## 2011-02-03 MED ORDER — SODIUM CHLORIDE 0.9 % IV SOLN
INTRAVENOUS | Status: DC | PRN
  Administered 2011-02-03 (×2): via INTRAVENOUS

## 2011-02-03 MED ORDER — SODIUM CHLORIDE 0.9 % IJ SOLN: 3.0000 mL | INTRAMUSCULAR | Status: DC | PRN

## 2011-02-03 MED ORDER — CINACALCET HCL 30 MG PO TABS
30.0000 mg | ORAL_TABLET | Freq: Every day | ORAL | Status: DC
  Administered 2011-02-04 – 2011-02-06 (×3): 30 mg via ORAL
  Filled 2011-02-03 (×4): qty 1

## 2011-02-03 MED ORDER — PHENYLEPHRINE HCL 10 MG/ML IJ SOLN
20.0000 mg | INTRAVENOUS | Status: DC | PRN
  Administered 2011-02-03: 100 ug/min via INTRAVENOUS

## 2011-02-03 MED ORDER — LISINOPRIL 20 MG PO TABS
30.0000 mg | ORAL_TABLET | Freq: Every day | ORAL | Status: DC
  Administered 2011-02-04 – 2011-02-05 (×2): 30 mg via ORAL
  Filled 2011-02-03 (×3): qty 1

## 2011-02-03 MED ORDER — ALBUMIN HUMAN 25 % IV SOLN
12.5000 g | Freq: Once | INTRAVENOUS | Status: DC
  Filled 2011-02-03: qty 50

## 2011-02-03 MED ORDER — PANTOPRAZOLE SODIUM 20 MG PO TBEC
20.0000 mg | DELAYED_RELEASE_TABLET | Freq: Every day | ORAL | Status: DC
  Administered 2011-02-04 – 2011-02-06 (×2): 20 mg via ORAL
  Filled 2011-02-03 (×3): qty 1

## 2011-02-03 MED ORDER — CALCIUM ACETATE (PHOS BINDER) 667 MG/5ML PO SOLN: 667.0000 mg | Freq: Three times a day (TID) | ORAL | Status: DC

## 2011-02-03 MED ORDER — ASPIRIN EC 81 MG PO TBEC
81.0000 mg | DELAYED_RELEASE_TABLET | Freq: Every day | ORAL | Status: DC
  Administered 2011-02-04 – 2011-02-06 (×2): 81 mg via ORAL
  Filled 2011-02-03 (×3): qty 1

## 2011-02-03 MED ORDER — SODIUM CHLORIDE 0.9 % IJ SOLN
3.0000 mL | Freq: Two times a day (BID) | INTRAMUSCULAR | Status: DC
  Administered 2011-02-03 – 2011-02-05 (×2): 3 mL via INTRAVENOUS

## 2011-02-03 MED ORDER — HEMOSTATIC AGENTS (NO CHARGE) OPTIME
TOPICAL | Status: DC | PRN
  Administered 2011-02-03 (×3): 1 via TOPICAL

## 2011-02-03 MED ORDER — BUPIVACAINE-EPINEPHRINE 0.25% -1:200000 IJ SOLN
INTRAMUSCULAR | Status: DC | PRN
  Administered 2011-02-03: 6 mL

## 2011-02-03 MED ORDER — DOCUSATE SODIUM 100 MG PO CAPS
100.0000 mg | ORAL_CAPSULE | Freq: Two times a day (BID) | ORAL | Status: DC
  Administered 2011-02-03 – 2011-02-06 (×5): 100 mg via ORAL
  Filled 2011-02-03 (×7): qty 1

## 2011-02-03 MED ORDER — ONDANSETRON HCL 4 MG/2ML IJ SOLN: 4.0000 mg | Freq: Four times a day (QID) | INTRAMUSCULAR | Status: DC | PRN

## 2011-02-03 MED ORDER — MIDAZOLAM HCL 5 MG/5ML IJ SOLN
INTRAMUSCULAR | Status: DC | PRN
  Administered 2011-02-03: 2 mg via INTRAVENOUS

## 2011-02-03 MED ORDER — SODIUM CHLORIDE 0.9 % IV SOLN
250.0000 mL | INTRAVENOUS | Status: DC
  Administered 2011-02-03: 250 mL via INTRAVENOUS

## 2011-02-03 MED ORDER — METOPROLOL TARTRATE 25 MG PO TABS: 25.0000 mg | ORAL_TABLET | Freq: Every day | ORAL | Status: DC

## 2011-02-03 MED ORDER — PHENOL 1.4 % MT LIQD
1.0000 | OROMUCOSAL | Status: DC | PRN
  Filled 2011-02-03: qty 177

## 2011-02-03 MED ORDER — ETOMIDATE 2 MG/ML IV SOLN
INTRAVENOUS | Status: DC | PRN
  Administered 2011-02-03: 20 mg via INTRAVENOUS

## 2011-02-03 MED ORDER — THROMBIN 20000 UNITS EX KIT
PACK | CUTANEOUS | Status: DC | PRN
  Administered 2011-02-03: 15:00:00 via TOPICAL

## 2011-02-03 MED ORDER — ASPIRIN 81 MG PO TABS: 81.0000 mg | ORAL_TABLET | Freq: Every day | ORAL | Status: DC

## 2011-02-03 MED ORDER — LIDOCAINE HCL (CARDIAC) 20 MG/ML IV SOLN
INTRAVENOUS | Status: DC | PRN
  Administered 2011-02-03: 85 mg via INTRAVENOUS

## 2011-02-03 MED ORDER — SODIUM CHLORIDE 0.9 % IJ SOLN: 9.0000 mL | INTRAMUSCULAR | Status: DC | PRN

## 2011-02-03 MED ORDER — RENA-VITE PO TABS
1.0000 | ORAL_TABLET | Freq: Every day | ORAL | Status: DC
  Administered 2011-02-04 – 2011-02-06 (×2): 1 via ORAL
  Filled 2011-02-03 (×3): qty 1

## 2011-02-03 MED ORDER — OXYCODONE-ACETAMINOPHEN 5-325 MG PO TABS
1.0000 | ORAL_TABLET | ORAL | Status: DC | PRN
  Administered 2011-02-05: 2 via ORAL
  Administered 2011-02-05: 1 via ORAL
  Filled 2011-02-03: qty 1
  Filled 2011-02-03: qty 2

## 2011-02-03 MED ORDER — SODIUM CHLORIDE 0.9 % IV SOLN
INTRAVENOUS | Status: DC | PRN
  Administered 2011-02-03 (×4): via INTRAVENOUS

## 2011-02-03 MED ORDER — ALUM & MAG HYDROXIDE-SIMETH 400-400-40 MG/5ML PO SUSP
30.0000 mL | Freq: Four times a day (QID) | ORAL | Status: DC | PRN
  Filled 2011-02-03: qty 30

## 2011-02-03 MED ORDER — GLIPIZIDE ER 5 MG PO TB24
5.0000 mg | ORAL_TABLET | Freq: Every day | ORAL | Status: DC
  Administered 2011-02-04 – 2011-02-05 (×2): 5 mg via ORAL
  Filled 2011-02-03 (×4): qty 1

## 2011-02-03 MED ORDER — CEFAZOLIN SODIUM 1-5 GM-% IV SOLN
INTRAVENOUS | Status: DC | PRN
  Administered 2011-02-03: 2 g via INTRAVENOUS

## 2011-02-03 MED ORDER — ACETAMINOPHEN 10 MG/ML IV SOLN
1000.0000 mg | Freq: Four times a day (QID) | INTRAVENOUS | Status: DC
  Administered 2011-02-03: 1000 mg via INTRAVENOUS
  Filled 2011-02-03 (×4): qty 100

## 2011-02-03 MED ORDER — HYDROXYZINE HCL 50 MG PO TABS
50.0000 mg | ORAL_TABLET | ORAL | Status: DC | PRN
  Filled 2011-02-03: qty 1

## 2011-02-03 MED ORDER — DIPHENHYDRAMINE HCL 12.5 MG/5ML PO ELIX
12.5000 mg | ORAL_SOLUTION | Freq: Four times a day (QID) | ORAL | Status: DC | PRN
  Filled 2011-02-03: qty 5

## 2011-02-03 MED ORDER — METOPROLOL SUCCINATE ER 25 MG PO TB24
25.0000 mg | ORAL_TABLET | Freq: Every day | ORAL | Status: DC
  Administered 2011-02-04 – 2011-02-05 (×2): 25 mg via ORAL
  Filled 2011-02-03 (×3): qty 1

## 2011-02-03 MED ORDER — DARBEPOETIN ALFA-POLYSORBATE 200 MCG/0.4ML IJ SOLN
200.0000 ug | INTRAMUSCULAR | Status: DC
  Administered 2011-02-04: 200 ug via INTRAVENOUS
  Filled 2011-02-03: qty 0.4

## 2011-02-03 MED ORDER — ALLOPURINOL 300 MG PO TABS
300.0000 mg | ORAL_TABLET | Freq: Every day | ORAL | Status: DC
  Administered 2011-02-03 – 2011-02-05 (×3): 300 mg via ORAL
  Filled 2011-02-03 (×4): qty 1

## 2011-02-03 MED ORDER — SENNA 8.6 MG PO TABS
1.0000 | ORAL_TABLET | Freq: Two times a day (BID) | ORAL | Status: DC
  Administered 2011-02-04 – 2011-02-06 (×4): 8.6 mg via ORAL
  Filled 2011-02-03 (×7): qty 1

## 2011-02-03 MED ORDER — HYDROMORPHONE HCL PF 1 MG/ML IJ SOLN: 0.2500 mg | INTRAMUSCULAR | Status: DC | PRN

## 2011-02-03 MED ORDER — ROCURONIUM BROMIDE 100 MG/10ML IV SOLN
INTRAVENOUS | Status: DC | PRN
  Administered 2011-02-03 (×2): 10 mg via INTRAVENOUS
  Administered 2011-02-03: 30 mg via INTRAVENOUS

## 2011-02-03 MED ORDER — DIAZEPAM 5 MG PO TABS: 5.0000 mg | ORAL_TABLET | Freq: Four times a day (QID) | ORAL | Status: DC | PRN

## 2011-02-03 MED ORDER — SODIUM CHLORIDE 0.9 % IV SOLN
INTRAVENOUS | Status: DC
  Administered 2011-02-03: 13:00:00 via INTRAVENOUS

## 2011-02-03 MED ORDER — ZOLPIDEM TARTRATE 5 MG PO TABS
5.0000 mg | ORAL_TABLET | Freq: Every evening | ORAL | Status: DC | PRN
  Administered 2011-02-06: 5 mg via ORAL
  Filled 2011-02-03: qty 1

## 2011-02-03 MED ORDER — LISINOPRIL 20 MG PO TABS
30.0000 mg | ORAL_TABLET | Freq: Every day | ORAL | Status: DC
  Filled 2011-02-03: qty 1

## 2011-02-03 MED ORDER — MORPHINE SULFATE 2 MG/ML IJ SOLN: 2.0000 mg | INTRAMUSCULAR | Status: DC | PRN

## 2011-02-03 MED ORDER — MORPHINE SULFATE (PF) 1 MG/ML IV SOLN
INTRAVENOUS | Status: DC
  Administered 2011-02-03: 20:00:00 via INTRAVENOUS
  Administered 2011-02-04: 8 mg via INTRAVENOUS
  Administered 2011-02-04 (×2): 9 mg via INTRAVENOUS
  Administered 2011-02-04: 10 mg via INTRAVENOUS
  Filled 2011-02-03: qty 25

## 2011-02-03 SURGICAL SUPPLY — 82 items
APL SKNCLS STERI-STRIP NONHPOA (GAUZE/BANDAGES/DRESSINGS) ×1
BENZOIN TINCTURE PRP APPL 2/3 (GAUZE/BANDAGES/DRESSINGS) ×1 IMPLANT
BLADE SURG ROTATE 9660 (MISCELLANEOUS) ×1 IMPLANT
BUR ROUND PRECISION 4.0 (BURR) ×2 IMPLANT
CARTRIDGE OIL MAESTRO DRILL (MISCELLANEOUS) IMPLANT
CLOTH BEACON ORANGE TIMEOUT ST (SAFETY) ×2 IMPLANT
CONT SPEC STER OR (MISCELLANEOUS) ×3 IMPLANT
CORDS BIPOLAR (ELECTRODE) ×2 IMPLANT
COVER SURGICAL LIGHT HANDLE (MISCELLANEOUS) ×2 IMPLANT
DIFFUSER DRILL AIR PNEUMATIC (MISCELLANEOUS) ×1 IMPLANT
DRAIN CHANNEL 15F RND FF W/TCR (WOUND CARE) IMPLANT
DRAPE C-ARM 42X72 X-RAY (DRAPES) ×2 IMPLANT
DRAPE ORTHO SPLIT 77X108 STRL (DRAPES) ×2
DRAPE POUCH INSTRU U-SHP 10X18 (DRAPES) ×2 IMPLANT
DRAPE SURG 17X23 STRL (DRAPES) ×6 IMPLANT
DRAPE SURG ORHT 6 SPLT 77X108 (DRAPES) ×1 IMPLANT
DURAPREP 26ML APPLICATOR (WOUND CARE) ×2 IMPLANT
ELECT BLADE 4.0 EZ CLEAN MEGAD (MISCELLANEOUS) ×2
ELECT CAUTERY BLADE 6.4 (BLADE) ×2 IMPLANT
ELECT REM PT RETURN 9FT ADLT (ELECTROSURGICAL) ×2
ELECTRODE BLDE 4.0 EZ CLN MEGD (MISCELLANEOUS) ×1 IMPLANT
ELECTRODE REM PT RTRN 9FT ADLT (ELECTROSURGICAL) ×1 IMPLANT
EVACUATOR SILICONE 100CC (DRAIN) IMPLANT
GAUZE SPONGE 4X4 12PLY STRL LF (GAUZE/BANDAGES/DRESSINGS) ×1 IMPLANT
GAUZE SPONGE 4X4 16PLY XRAY LF (GAUZE/BANDAGES/DRESSINGS) ×5 IMPLANT
GLOVE BIO SURGEON STRL SZ 6.5 (GLOVE) ×1 IMPLANT
GLOVE BIO SURGEON STRL SZ8 (GLOVE) ×2 IMPLANT
GLOVE BIOGEL M 7.0 STRL (GLOVE) ×1 IMPLANT
GLOVE BIOGEL PI IND STRL 6.5 (GLOVE) IMPLANT
GLOVE BIOGEL PI IND STRL 7.5 (GLOVE) IMPLANT
GLOVE BIOGEL PI IND STRL 8 (GLOVE) ×1 IMPLANT
GLOVE BIOGEL PI INDICATOR 6.5 (GLOVE) ×1
GLOVE BIOGEL PI INDICATOR 7.5 (GLOVE) ×2
GLOVE BIOGEL PI INDICATOR 8 (GLOVE) ×1
GLOVE ECLIPSE 7.0 STRL STRAW (GLOVE) ×1 IMPLANT
GLOVE SURG SS PI 6.5 STRL IVOR (GLOVE) ×1 IMPLANT
GLOVE SURG SS PI 7.5 STRL IVOR (GLOVE) ×2 IMPLANT
GOWN PREVENTION PLUS XLARGE (GOWN DISPOSABLE) ×3 IMPLANT
GOWN STRL NON-REIN LRG LVL3 (GOWN DISPOSABLE) ×4 IMPLANT
IV CATH 14GX2 1/4 (CATHETERS) ×2 IMPLANT
KIT BASIN OR (CUSTOM PROCEDURE TRAY) ×2 IMPLANT
KIT POSITION SURG JACKSON T1 (MISCELLANEOUS) ×1 IMPLANT
KIT ROOM TURNOVER OR (KITS) ×2 IMPLANT
MARKER SKIN DUAL TIP RULER LAB (MISCELLANEOUS) ×2 IMPLANT
NDL HYPO 25GX1X1/2 BEV (NEEDLE) ×1 IMPLANT
NDL SPNL 18GX3.5 QUINCKE PK (NEEDLE) ×2 IMPLANT
NEEDLE BONE MARROW 8GX6 FENEST (NEEDLE) ×1 IMPLANT
NEEDLE HYPO 25GX1X1/2 BEV (NEEDLE) ×2 IMPLANT
NEEDLE SPNL 18GX3.5 QUINCKE PK (NEEDLE) ×4 IMPLANT
NS IRRIG 1000ML POUR BTL (IV SOLUTION) ×4 IMPLANT
OIL CARTRIDGE MAESTRO DRILL (MISCELLANEOUS) ×2
PACK LAMINECTOMY ORTHO (CUSTOM PROCEDURE TRAY) ×2 IMPLANT
PACK UNIVERSAL I (CUSTOM PROCEDURE TRAY) ×2 IMPLANT
PACK VITOSS BIOACTIVE 10CC (Neuro Prosthesis/Implant) ×1 IMPLANT
PAD ARMBOARD 7.5X6 YLW CONV (MISCELLANEOUS) ×2 IMPLANT
PATTIES SURGICAL .5 X1 (DISPOSABLE) ×2 IMPLANT
PATTIES SURGICAL .5X1.5 (GAUZE/BANDAGES/DRESSINGS) ×2 IMPLANT
ROD EXPEDIUM PREBENT 5.5X35MM (Rod) ×2 IMPLANT
SCREW EXPEDIUM POLYAXIAL 7X40M (Screw) ×2 IMPLANT
SCREW POLYAXIAL 7X45MM (Screw) ×2 IMPLANT
SCREW SET SINGLE INNER (Screw) ×4 IMPLANT
SPONGE GAUZE 4X4 12PLY (GAUZE/BANDAGES/DRESSINGS) ×2 IMPLANT
SPONGE INTESTINAL PEANUT (DISPOSABLE) ×2 IMPLANT
SPONGE SURGIFOAM ABS GEL 100 (HEMOSTASIS) ×2 IMPLANT
STRIP CLOSURE SKIN 1/2X4 (GAUZE/BANDAGES/DRESSINGS) ×1 IMPLANT
SURGIFLO TRUKIT (HEMOSTASIS) ×1 IMPLANT
SUT ETHILON 3 0 PS 1 (SUTURE) ×1 IMPLANT
SUT MNCRL AB 3-0 PS2 18 (SUTURE) ×1 IMPLANT
SUT MNCRL AB 4-0 PS2 18 (SUTURE) ×2 IMPLANT
SUT VIC AB 0 CT1 18XCR BRD 8 (SUTURE) ×1 IMPLANT
SUT VIC AB 0 CT1 8-18 (SUTURE) ×2
SUT VIC AB 1 CT1 18XCR BRD 8 (SUTURE) ×2 IMPLANT
SUT VIC AB 1 CT1 8-18 (SUTURE) ×2
SUT VIC AB 2-0 CT2 18 VCP726D (SUTURE) ×2 IMPLANT
SYR 20CC LL (SYRINGE) ×2 IMPLANT
SYR BULB IRRIGATION 50ML (SYRINGE) ×2 IMPLANT
SYR CONTROL 10ML LL (SYRINGE) ×3 IMPLANT
TOWEL OR 17X24 6PK STRL BLUE (TOWEL DISPOSABLE) ×2 IMPLANT
TOWEL OR 17X26 10 PK STRL BLUE (TOWEL DISPOSABLE) ×2 IMPLANT
TRAY FOLEY CATH 14FR (SET/KITS/TRAYS/PACK) ×2 IMPLANT
WATER STERILE IRR 1000ML POUR (IV SOLUTION) ×2 IMPLANT
YANKAUER SUCT BULB TIP NO VENT (SUCTIONS) ×2 IMPLANT

## 2011-02-03 NOTE — Op Note (Signed)
[name], [name]:  [id number]  MEDICAL RECORD NO.:  [id number]  LOCATION:  5006                         FACILITY:  [facility]  PHYSICIAN:  Jeffrey Bamberg, MD      DATE OF BIRTH:  07-Jul-1945  DATE OF PROCEDURE:  02/03/2011 DATE OF DISCHARGE:                              OPERATIVE REPORT   PREOPERATIVE DIAGNOSES: 1. Right-sided L3 radiculopathy. 2. Severe L3-4 degenerative disk disease with severe bilateral neural     foraminal stenosis.  POSTOPERATIVE DIAGNOSES: 1. Right-sided L3 radiculopathy. 2. Severe L3-4 degenerative disk disease with severe bilateral neural     foraminal stenosis.  PROCEDURES: 1. Right-sided L3-4 transforaminal lumbar interbody fusion. 2. Left-sided posterolateral lumbar fusion. 3. Placement of posterior instrumentation L3, L4. 4. Use of local autograft. 5. Intraoperative bone marrow aspiration from a separate incision from     the patient's left iliac crest. 6. Intraoperative use of fluoroscopy.  SURGEON:  Jeffrey Bamberg, MD  ASSISTANT:  Jeffrey Mayer, PA-C  ANESTHESIA:  General endotracheal anesthesia.  COMPLICATIONS:  None.  DISPOSITION:  Stable.  ESTIMATED BLOOD LOSS:  200 mL.  FINDINGS:  Severe L3-4 degenerative disk disease with severe and profound neural foraminal stenosis on the right side at the L3-4 level.  SPECIMENS SUBMITTED:  L3-4 intervertebral disc.  INDICATIONS FOR PROCEDURE:  Briefly, Mr. Jeffrey Manning is a very pleasant 65- year-old male, who initially presented to me with severe and debilitating pain in the right leg.  An MRI was clearly notable for severe and profound degenerative disk disease at the L3-4 level with severe and profound neural foraminal stenosis.  The patient continued to have ongoing pain despite conservative measures and, therefore, we did have a discussion regarding going forward with a transforaminal lumbar interbody fusion for the reasons noted above.  Of note, the patient was noted  to have a very regular appearing L3-4 interspace and infection was in the differential diagnosis.  The patient preoperatively was sent for a CT-guided biopsy and this was negative for infection.  Also of note, I did send the L3-4 intervertebral disk to pathology for additional evaluation.  OPERATIVE DETAILS:  On February 03, 2011, the patient was brought to surgery and general endotracheal anesthesia was administered.  The patient has placed prone on a well-padded flat Jackson bed with a Wilson frame.  All bony prominences were meticulously padded.  Ancef 2 g were given for antibiotic prophylaxis and  SCDs were placed.  Time-out procedure was performed.  The back was prepped and draped in the usual sterile fashion with Betadine scrub and Betadine.  I then made an incision from approximately spinous process of L2 to approximately spinous process of L4.  The L3 and L4 lamina were subperiosteally exposed.  I did obtain a lateral fluoroscopic view that did confirm the appropriate level.  The L3-4 facet joint was noted to be extremely arthritic with extensive osteophytes noted throughout.  I did subperiosteally expose the transverse processes of L3 and L4 bilaterally.  Using AP and lateral fluoroscopy, I did go forward with cannulating the L3 and L4 pedicles on both the right and the left sides. A gearshift probe was used to cannulate the pedicle.  A ball-tip  probe was used to confirm.  There is no cortical violation and a 5-mm tap was used bilaterally.  Of note, the patient was noted to have excessively soft bone.  I then sealed the holes using bone wax.  I then went forward with a thorough and complete facetectomy on the right side.  I did entirely remove the inferior articular process of L3 and the superior articular process of L4.  The exiting L3 nerve was readily identified. It was clearly noted to be under severe and profound compression secondary to the superior articular process of  L4.  There was noted to be an excessive amount of scar tissue and granulation tissue noted about the L3 nerve.  This was not removed as the nerve was adequately decompressed by removing the facet joint and additional removal of the granulation tissue would only cause undue trauma to the nerve, and I simply left the scar tissue were it was.  However, the exiting L3 nerve was noted to be completely decompressed at the termination of the facetectomy.  I then used a 15 blade knife and gained access to the L3-4 intervertebral space.  I did use a small 7-mm intervertebral scraper, and it was noted that the endplates were entirely compromised both at the L3 and L4 vertebral bodies.  I, therefore, made a decision to not place a structural interbody device, but I did pack the interbody space with bone graft obtained from removing the facet joint on the right side.  This was done after I gently curetting away the interbody space, but again, there is no endplate integrity noted upon initial evaluation of the intervertebral space.  I then placed L3 and L4 screws on the right side.  A 7 x 40-mm screws were placed at L4 and 7 x 45-mm screws were placed at L3.  A 35-mm rod was placed and caps were also placed and a final locking procedure was performed.  I then copiously irrigated the wound with 1 L of normal saline.  I turned my attention next towards the patient's left side.  On the left side, a 7 x 45-mm screw was placed at L3 and a 7 x 40-mm screw was placed at L4.  A 35-mm rod was placed and caps were applied and a final locking maneuver was performed.  Of note, prior to placing the hardware on the left side, I did use a 4-mm high- speed bur and did thoroughly and completely decorticate the posterior elements of L3 and L4 and including the transverse processes of L3 and L4.  I did also obtain a total of 7 mL of bone marrow aspirate from the patient's left iliac crest and this was mixed with 10 mL of  Vitoss VA. This was packed in the posterolateral gutter and into the posterior elements on the left side prior to placing screws.  Also of note, I did use triggered EMG to test each of the screws and there is no screw that tested below 15 milliamps.  I was extremely pleased with the appearance on both AP and lateral fluoroscopic views.  I then meticulously ensured that the bleeding was controlled using bipolar electrocautery in addition of thrombin and Gelfoam as well as FloSeal.  I then closed the fascia using #1 Vicryl.  The subcutaneous layer was closed using 2-0 Vicryl and the skin was closed using 3-0 nylon.  Sterile dressing was applied and the patient was awoken from general endotracheal anesthesia and transferred to recovery in stable condition.  Of note, Jeffrey Manning was my assistant throughout the procedure and aided in essential retraction and suctioning required throughout the surgery.  Also of note, all instrument counts were correct at the termination of the procedure.     Jeffrey Bamberg, MD     MD/MEDQ  D:  02/03/2011  T:  02/03/2011  Job:  [id number]  cc:   Daneil Dolin, MD

## 2011-02-03 NOTE — Anesthesia Preprocedure Evaluation (Addendum)
Anesthesia Evaluation  Patient identified by MRN, date of birth, ID band Patient awake    Reviewed: Allergy & Precautions, H&P , NPO status , Patient's Chart, lab work & pertinent test results  Airway Mallampati: II TM Distance: >3 FB Neck ROM: full    Dental  (+) Teeth Intact, Chipped and Dental Advisory Given,    Pulmonary shortness of breath and with exertion, sleep apnea , former smoker (quit 25 years ago)         Cardiovascular Exercise Tolerance: Poor hypertension, Pt. on medications and Pt. on home beta blockers + angina + dysrhythmias (hx of A-Flutter n/a today) Regular Normal    Neuro/Psych  Neuromuscular disease    GI/Hepatic hiatal hernia,   Endo/Other  Diabetes mellitus- (glucose 164 @ 13:30), Well Controlled, Type 2, Oral Hypoglycemic Agents  Renal/GU ESRFRenal disease (pt dialyzed 02-02-11)     Musculoskeletal   Abdominal   Peds  Hematology   Anesthesia Other Findings   Reproductive/Obstetrics                         Anesthesia Physical Anesthesia Plan  ASA: IV  Anesthesia Plan: General   Post-op Pain Management:    Induction: Intravenous  Airway Management Planned: Oral ETT  Additional Equipment:   Intra-op Plan:   Post-operative Plan:   Informed Consent: I have reviewed the patients History and Physical, chart, labs and discussed the procedure including the risks, benefits and alternatives for the proposed anesthesia with the patient or authorized representative who has indicated his/her understanding and acceptance.     Plan Discussed with: CRNA and Surgeon  Anesthesia Plan Comments:         Anesthesia Quick Evaluation

## 2011-02-03 NOTE — Preoperative (Signed)
Beta Blockers  :Pt took metoprolol @ 9am today

## 2011-02-03 NOTE — Anesthesia Postprocedure Evaluation (Signed)
Anesthesia Post Note  Patient: Jeffrey Manning  Procedure(s) Performed:  POSTERIOR LUMBAR FUSION 1 LEVEL - Right side lumbar transforminal interbody fusion L3-4 with instrumentation autograft insertion of prosthetic device  Anesthesia type: General  Patient location: PACU  Post pain: Pain level controlled and Adequate analgesia  Post assessment: Post-op Vital signs reviewed, Patient's Cardiovascular Status Stable, Respiratory Function Stable, Patent Airway and Pain level controlled  Last Vitals:  Filed Vitals:   02/03/11 1830  BP: 171/94  Pulse: 67  Temp: 36.9 C  Resp:     Post vital signs: Reviewed and stable  Level of consciousness: awake, alert  and oriented  Complications: No apparent anesthesia complications

## 2011-02-03 NOTE — Transfer of Care (Signed)
Immediate Anesthesia Transfer of Care Note  Patient: Jeffrey Manning  Procedure(s) Performed:  POSTERIOR LUMBAR FUSION 1 LEVEL - Right side lumbar transforminal interbody fusion L3-4 with instrumentation autograft insertion of prosthetic device  Patient Location: PACU  Anesthesia Type: General  Level of Consciousness: sedated  Airway & Oxygen Therapy: Patient Spontanous Breathing  Post-op Assessment: Report given to PACU RN and Post -op Vital signs reviewed and stable  Post vital signs: Reviewed and stable  Complications: No apparent anesthesia complications

## 2011-02-03 NOTE — Consult Note (Signed)
Reason for Consult:End Stage Renal Disease, DM, Hypertension Referring Physician: Korde Manning Jeffrey Manning is an 65 y.o. male.  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  Patient is confused and cannot give history but knows me.  HD MWF at [facility] Dialyzes at [facility] on MWF since [place]. Primary Nephrologist Jeffrey Manning. EDW [place]. HD Bath [place], Dialyzer 200nr, Heparin [place]. Access LUA AVF.  Past Medical History  Diagnosis Date  . Hypertension   . Diabetes mellitus   . Dialysis patient   . Back pain   . Dysrhythmia   . Angina     approx. 10 years ago  . Shortness of breath   . Renal failure     dialysis T, Th, Sa.  [address], Dr. Joline Manning  . Hiatal hernia   . Arthritis   . Sleep apnea     diagnosed approx 15 years,  has not used cpap machine in 4 years    Past Surgical History  Procedure Date  . Av fistula placement   . Eye surgery     laser surgery 2 year ago bilaterally cataracts    History reviewed. No pertinent family history.  Social History:  reports that he has quit smoking. His smoking use included Cigarettes. He quit after 20 years of use. He does not have any smokeless tobacco history on file. He reports that he drinks alcohol. He reports that he does not use illicit drugs.  Allergies:  Allergies  Allergen Reactions  . Iohexol Nausea And Vomiting    Medications:  Prior to Admission:  Prescriptions prior to admission  Medication Sig Dispense Refill  . allopurinol (ZYLOPRIM) 300 MG tablet Take 300 mg by mouth at bedtime.        Marland Kitchen aspirin 81 MG tablet Take 81 mg by mouth daily.        . calcium acetate, Phos Binder, (PHOSLYRA) 667 MG/5ML SOLN Take 667 mg by mouth 3 (three) times daily with meals.        . cinacalcet (SENSIPAR) 30 MG tablet Take 30 mg by mouth daily.        . folic acid-vitamin b complex-vitamin c-selenium-zinc (DIALYVITE) 3 MG TABS Take 1 tablet by mouth daily.         Marland Kitchen glipiZIDE (GLUCOTROL XL) 5 MG 24 hr tablet Take 5 mg by mouth daily.        . lansoprazole (PREVACID) 30 MG capsule Take 30 mg by mouth daily.        Marland Kitchen lisinopril (PRINIVIL,ZESTRIL) 30 MG tablet Take 30 mg by mouth daily.        . metoprolol tartrate (LOPRESSOR) 25 MG tablet Take 25 mg by mouth daily.        Marland Kitchen oxyCODONE-acetaminophen (PERCOCET) 5-325 MG per tablet Take 1 tablet by mouth every 6 (six) hours as needed. For pain       Zemplar [place], Hectorol no, Calcitriol no. Epogen [place], Procrit [place], Aranesp no. Infed no , Venofer [place]  Results for orders placed during the hospital encounter of 02/03/11 (from the past 48 hour(s))  BASIC METABOLIC PANEL     Status: Abnormal   Collection Time   02/03/11 10:55 AM      Component Value Range Comment   Sodium 144  135 - 145 (mEq/L)    Potassium 3.8  3.5 - 5.1 (mEq/L)    Chloride 102  96 - 112 (mEq/L)  CO2 33 (*) 19 - 32 (mEq/L)    Glucose, Bld 183 (*) 70 - 99 (mg/dL)    BUN 14  6 - 23 (mg/dL)    Creatinine, Ser 1.61 (*) 0.50 - 1.35 (mg/dL)    Calcium 9.9  8.4 - 10.5 (mg/dL)    GFR calc non Af Amer 13 (*) >90 (mL/min)    GFR calc Af Amer 15 (*) >90 (mL/min)   CBC     Status: Abnormal   Collection Time   02/03/11 10:55 AM      Component Value Range Comment   WBC 4.0  4.0 - 10.5 (K/uL)    RBC 3.35 (*) 4.22 - 5.81 (MIL/uL)    Hemoglobin 9.4 (*) 13.0 - 17.0 (g/dL)    HCT 09.6 (*) 04.5 - 52.0 (%)    MCV 92.8  78.0 - 100.0 (fL)    MCH 28.1  26.0 - 34.0 (pg)    MCHC 30.2  30.0 - 36.0 (g/dL)    RDW 40.9 (*) 81.1 - 15.5 (%)    Platelets 197  150 - 400 (K/uL)   TYPE AND SCREEN     Status: Normal   Collection Time   02/03/11 11:05 AM      Component Value Range Comment   ABO/RH(D) A NEG      Antibody Screen NEG      Sample Expiration 02/06/2011     GLUCOSE, CAPILLARY     Status: Abnormal   Collection Time   02/03/11 11:07 AM      Component Value Range Comment   Glucose-Capillary 185 (*) 70 - 99 (mg/dL)   GLUCOSE, CAPILLARY      Status: Abnormal   Collection Time   02/03/11  1:34 PM      Component Value Range Comment   Glucose-Capillary 164 (*) 70 - 99 (mg/dL)   POCT I-STAT 4, (NA,K, GLUC, HGB,HCT)     Status: Abnormal   Collection Time   02/03/11  4:17 PM      Component Value Range Comment   Sodium 139  135 - 145 (mEq/L)    Potassium 4.1  3.5 - 5.1 (mEq/L)    Glucose, Bld 166 (*) 70 - 99 (mg/dL)    HCT 91.4 (*) 78.2 - 52.0 (%)    Hemoglobin 10.5 (*) 13.0 - 17.0 (g/dL)     Dg Lumbar Spine 2-3 Views  02/03/2011  *RADIOLOGY REPORT*  Clinical Data: Back pain.   Renal failure. Diabetes.  LUMBAR SPINE - 2-3 VIEW  Comparison: MRI 12/12/2010.  CT 12/31/2010.  Findings: AP and lateral C-arm films document placement of L3-4 pedicle screw and rod fusion.  No adverse features.  IMPRESSION: As above.  Original Report Authenticated By: Elsie Stain, M.D.    @ROS @ Blood pressure 171/94, pulse 67, temperature 98.4 F (36.9 C), temperature source Oral, resp. rate 20, SpO2 99.00%. @PHYSEXAMBYAGE2 @ Physical Examination: General appearance - edematous, obese Mental status - confused, drowsy, inappropriate safety awareness Eyes - funduscopic exam abnormal DM reatinopathy Mouth - dental hygiene poor and carious dentition Lymphatics - posterior cervical nodes Chest - rales noted bibasilar, decreased air entry noted poor BS, decreased expansion Heart - S1 and S2 normal, S4 present, systolic murmur GR @/6 at 2nd left intercostal space, PMI 12 cm lat to MSL. 1 plus edema Abdomen - bowel sounds diminished hepatomegaly down 6 cm Obese Extremities - pedal edema 1 plus +, peripheral pulses abnormal   Decreased DP, trophic changes in feet, bilat fem bruits.  LUA AVF  Skin - DRY, trophic changes LE   Assessment/Plan: 1 S/P back surgery per Ortho 2 ESRD: will plan no Hep HD, get records 3 Hypertension: meds & vol 4. Anemia of ESRD: epo 5. Metabolic Bone Disease: vit D, Sensipar 6 Obesity 7OSA 8 DM monitor and adjust 9  PVD  P as above, HD in am.  Get records, DM control Jeffrey Manning L 02/03/2011, 7:11 PM

## 2011-02-03 NOTE — H&P (Signed)
PREOPERATIVE H&P  Chief Complaint: right leg pain  HPI: Jeffrey Manning is a 65 y.o. male who presents with right leg pain  Past Medical History  Diagnosis Date  . Hypertension   . Diabetes mellitus   . Dialysis patient   . Back pain   . Dysrhythmia   . Angina     approx. 10 years ago  . Shortness of breath   . Renal failure     dialysis T, Th, Sa.  [address], Dr. Joline Maxcy  . Hiatal hernia   . Arthritis   . Sleep apnea     diagnosed approx 15 years,  has not used cpap machine in 4 years   Past Surgical History  Procedure Date  . Av fistula placement   . Eye surgery     laser surgery 2 year ago bilaterally cataracts   History   Social History  . Marital Status: Married    Spouse Name: N/A    Number of Children: N/A  . Years of Education: N/A   Social History Main Topics  . Smoking status: Former Smoker -- 20 years    Types: Cigarettes  . Smokeless tobacco: None   Comment: stopped apprx 25 years ago  . Alcohol Use: Yes  . Drug Use: No  . Sexually Active:    Other Topics Concern  . None   Social History Narrative  . None   History reviewed. No pertinent family history. Allergies  Allergen Reactions  . Iohexol Nausea And Vomiting   Prior to Admission medications   Medication Sig Start Date End Date Taking? Authorizing Provider  allopurinol (ZYLOPRIM) 300 MG tablet Take 300 mg by mouth at bedtime.     Yes Historical Provider, MD  aspirin 81 MG tablet Take 81 mg by mouth daily.     Yes Historical Provider, MD  calcium acetate, Phos Binder, (PHOSLYRA) 667 MG/5ML SOLN Take 667 mg by mouth 3 (three) times daily with meals.     Yes Historical Provider, MD  cinacalcet (SENSIPAR) 30 MG tablet Take 30 mg by mouth daily.     Yes Historical Provider, MD  folic acid-vitamin b complex-vitamin c-selenium-zinc (DIALYVITE) 3 MG TABS Take 1 tablet by mouth daily.     Yes Historical Provider, MD  glipiZIDE (GLUCOTROL XL) 5 MG 24 hr tablet Take 5 mg by mouth daily.     Yes  Historical Provider, MD  lansoprazole (PREVACID) 30 MG capsule Take 30 mg by mouth daily.     Yes Historical Provider, MD  lisinopril (PRINIVIL,ZESTRIL) 30 MG tablet Take 30 mg by mouth daily.     Yes Historical Provider, MD  metoprolol tartrate (LOPRESSOR) 25 MG tablet Take 25 mg by mouth daily.     Yes Historical Provider, MD  oxyCODONE-acetaminophen (PERCOCET) 5-325 MG per tablet Take 1 tablet by mouth every 6 (six) hours as needed. For pain   Yes Historical Provider, MD     All other systems have been reviewed and were otherwise negative with the exception of those mentioned in the HPI and as above.  Physical Exam: Filed Vitals:   02/03/11 1055  BP: 166/64  Pulse: 68  Temp: 98.3 F (36.8 C)  Resp: 20    General: Alert, no acute distress Cardiovascular: No pedal edema Respiratory: No cyanosis, no use of accessory musculature GI: No organomegaly, abdomen is soft and non-tender Skin: No lesions in the area of chief complaint Neurologic: Sensation intact distally Psychiatric: Patient is competent for consent with normal mood  and affect Lymphatic: No axillary or cervical lymphadenopathy  MUSCULOSKELETAL: 4/5 weakness, right EHL  Assessment/Plan: right leg pain Plan for Procedure(s): Right L3/4 TLIF   Jeffrey Manning 02/03/2011 1:32 PM

## 2011-02-04 ENCOUNTER — Inpatient Hospital Stay (HOSPITAL_COMMUNITY): Payer: Medicare Other

## 2011-02-04 ENCOUNTER — Encounter (HOSPITAL_COMMUNITY): Payer: Self-pay

## 2011-02-04 LAB — CBC
MCV: 92.9 fL (ref 78.0–100.0)
Platelets: 133 10*3/uL — ABNORMAL LOW (ref 150–400)
RBC: 2.97 MIL/uL — ABNORMAL LOW (ref 4.22–5.81)
WBC: 5.2 10*3/uL (ref 4.0–10.5)

## 2011-02-04 LAB — GLUCOSE, CAPILLARY: Glucose-Capillary: 176 mg/dL — ABNORMAL HIGH (ref 70–99)

## 2011-02-04 LAB — COMPREHENSIVE METABOLIC PANEL
ALT: 8 U/L (ref 0–53)
AST: 14 U/L (ref 0–37)
Alkaline Phosphatase: 83 U/L (ref 39–117)
CO2: 30 mEq/L (ref 19–32)
Chloride: 102 mEq/L (ref 96–112)
GFR calc non Af Amer: 9 mL/min — ABNORMAL LOW (ref >90)
Sodium: 140 mEq/L (ref 135–145)
Total Bilirubin: 0.4 mg/dL (ref 0.3–1.2)

## 2011-02-04 MED ORDER — ALTEPLASE 2 MG IJ SOLR
2.0000 mg | Freq: Once | INTRAMUSCULAR | Status: AC | PRN
  Filled 2011-02-04: qty 2

## 2011-02-04 MED ORDER — DARBEPOETIN ALFA-POLYSORBATE 200 MCG/0.4ML IJ SOLN
INTRAMUSCULAR | Status: AC
  Administered 2011-02-04: 200 ug via INTRAVENOUS
  Filled 2011-02-04: qty 0.4

## 2011-02-04 MED ORDER — OXYCODONE HCL 10 MG PO TB12
10.0000 mg | ORAL_TABLET | Freq: Two times a day (BID) | ORAL | Status: DC
  Administered 2011-02-04 – 2011-02-06 (×5): 10 mg via ORAL
  Filled 2011-02-04 (×5): qty 1

## 2011-02-04 MED ORDER — MORPHINE SULFATE (PF) 1 MG/ML IV SOLN
INTRAVENOUS | Status: AC
  Administered 2011-02-04: 9 mg via INTRAVENOUS
  Filled 2011-02-04: qty 25

## 2011-02-04 MED ORDER — LIDOCAINE-PRILOCAINE 2.5-2.5 % EX CREA
1.0000 "application " | TOPICAL_CREAM | CUTANEOUS | Status: DC | PRN
  Filled 2011-02-04: qty 5

## 2011-02-04 MED ORDER — NEPRO/CARBSTEADY PO LIQD
237.0000 mL | ORAL | Status: DC | PRN
  Filled 2011-02-04: qty 237

## 2011-02-04 MED ORDER — SODIUM CHLORIDE 0.9 % IV SOLN: 100.0000 mL | INTRAVENOUS | Status: DC | PRN

## 2011-02-04 MED ORDER — PENTAFLUOROPROP-TETRAFLUOROETH EX AERO
1.0000 "application " | INHALATION_SPRAY | CUTANEOUS | Status: DC | PRN
  Filled 2011-02-04: qty 103.5

## 2011-02-04 MED ORDER — LIDOCAINE HCL (PF) 1 % IJ SOLN: 5.0000 mL | INTRAMUSCULAR | Status: DC | PRN

## 2011-02-04 NOTE — Progress Notes (Signed)
Physical Therapy Evaluation Patient Details Name: Jeffrey Manning MRN: [id number] DOB: 09/06/1945 Today's Date: 02/04/2011  Problem List:  Patient Active Problem List  Diagnoses  . DM  . HYPERLIPIDEMIA  . HYPERTENSION  . ABNORMAL HEART RHYTHMS  . End stage renal disease  . SLEEP APNEA  . Low back pain radiating to right leg  . Anemia of chronic renal failure  . Obesity  . Diabetes mellitus    Past Medical History:  Past Medical History  Diagnosis Date  . Hypertension   . Diabetes mellitus   . Dialysis patient   . Back pain   . Dysrhythmia   . Angina     approx. 10 years ago  . Shortness of breath   . Renal failure     dialysis T, Th, Sa.  [address], Dr. Joline Maxcy  . Hiatal hernia   . Arthritis   . Sleep apnea     diagnosed approx 15 years,  has not used cpap machine in 4 years   Past Surgical History:  Past Surgical History  Procedure Date  . Av fistula placement   . Eye surgery     laser surgery 2 year ago bilaterally cataracts    PT Assessment/Plan/Recommendation PT Assessment Clinical Impression Statement: Pt. is pleasant 65 yo s/p back surgery with supportive wife.  Pt needs acute PT to advance mobility and gait for DC home.   PT Recommendation/Assessment: Patient will need skilled PT in the acute care venue PT Problem List: Decreased activity tolerance;Decreased balance;Decreased mobility;Decreased knowledge of use of DME;Decreased knowledge of precautions;Obesity Barriers to Discharge: None PT Therapy Diagnosis : Difficulty walking;Acute pain PT Plan PT Frequency: Min 6X/week PT Treatment/Interventions: DME instruction;Gait training;Stair training;Functional mobility training;Balance training;Patient/family education PT Recommendation Follow Up Recommendations: Home health PT Equipment Recommended: Rolling walker with 5" wheels (needs bariatric RW) PT Goals  Acute Rehab PT Goals PT Goal Formulation: With patient Time For Goal Achievement: 7  days Pt will go Supine/Side to Sit: Independently PT Goal: Supine/Side to Sit - Progress: Progressing toward goal Pt will go Sit to Supine/Side: Independently Pt will Transfer Sit to Stand/Stand to Sit: with modified independence PT Transfer Goal: Sit to Stand/Stand to Sit - Progress: Progressing toward goal Pt will Ambulate: 51 - 150 feet;with modified independence;with rolling walker (bariatric) PT Goal: Ambulate - Progress: Progressing toward goal Pt will Go Up / Down Stairs: 3-5 stairs;with rail(s);with min assist  PT Evaluation Precautions/Restrictions  Precautions Precautions: Back Precaution Booklet Issued: Yes (comment) (back handout provided and posted) Prior Functioning  Home Living Lives With: Spouse Receives Help From: Family Type of Home: House Home Layout: One level Home Access: Stairs to enter Entrance Stairs-Rails: Right;Left;Can reach both Secretary/administrator of Steps: 5 Bathroom Shower/Tub: Forensic scientist: Standard Home Adaptive Equipment: Quad cane Additional Comments: standard width RW at home Prior Function Level of Independence: Independent with basic ADLs;Needs assistance with gait (pt. uses w/c to enter and leave HD) Driving: No Leisure: Hobbies-yes (Comment) Comments: Gardening Cognition Cognition Arousal/Alertness: Awake/alert Overall Cognitive Status: Appears within functional limits for tasks assessed Orientation Level: Oriented X4 Sensation/Coordination Sensation Light Touch: Not tested Extremity Assessment RLE Assessment RLE Assessment: Within Functional Limits LLE Assessment LLE Assessment: Within Functional Limits Mobility (including Balance) Bed Mobility Bed Mobility: Yes Rolling Left: 4: Min assist Rolling Left Details (indicate cue type and reason): for log rolling technique Left Sidelying to Sit: 1: +2 Total assist;HOB flat;Patient percentage (comment) (pt. 60%) Left Sidelying to Sit Details (indicate  cue type  and reason): vc's for technique and back precautions Sit to Supine - Left: Not tested (comment) Transfers Transfers: Yes Sit to Stand: 1: +2 Total assist;From elevated surface;From bed;Patient percentage (comment) (pt. 85%) Sit to Stand Details (indicate cue type and reason): cues for hand placement and safe technique Stand to Sit: 1: +2 Total assist;Patient percentage (comment) (pt. 85%) Stand to Sit Details: cues for hand placement Ambulation/Gait Ambulation/Gait: Yes Ambulation/Gait Assistance: 1: +1 Total assist;Patient percentage (comment) Ambulation/Gait Assistance Details (indicate cue type and reason): pt. 85% Ambulation Distance (Feet): 3 Feet Assistive device: Rolling walker (bariatric) Gait Pattern:  (needed cues to stand erect)  Posture/Postural Control Posture/Postural Control: No significant limitations Balance Balance Assessed: Yes Static Sitting Balance Static Sitting - Level of Assistance: 7: Independent Static Standing Balance Static Standing - Level of Assistance: 4: Min assist Exercise    End of Session PT - End of Session Equipment Utilized During Treatment: Gait belt Activity Tolerance: Patient tolerated treatment well Patient left: in chair;with family/visitor present (straight back chair) Nurse Communication: Other (comment) (back precaution sheet posted) General Behavior During Session: [facility] for tasks performed Cognition: [facility] for tasks performed  Ferman Hamming 02/04/2011, 12:12 PM Acute Rehabilitation Services [phone] (pager)

## 2011-02-04 NOTE — Progress Notes (Signed)
Inpatient Diabetes Program Recommendations  AACE/ADA: New Consensus Statement on Inpatient Glycemic Control (2009)  Target Ranges:  Prepandial:   less than 140 mg/dL      Peak postprandial:   less than 180 mg/dL (1-2 hours)      Critically ill patients:  140 - 180 mg/dL   Reason for Visit: CBG's fairly well controlled while NPO and correction q 4 hrs., but once eating need to change to tidwc,  Inpatient Diabetes Program Recommendations Correction (SSI): Please change correction to tid with/before meals HgbA1C: Please check.  No documentation of A1C in past year.  Note:

## 2011-02-04 NOTE — Progress Notes (Signed)
Occupational Therapy Evaluation Patient Details Name: Jeffrey Manning MRN: [id number] DOB: Jun 02, 1945 Today's Date: 02/04/2011  Problem List:  Patient Active Problem List  Diagnoses  . DM  . HYPERLIPIDEMIA  . HYPERTENSION  . ABNORMAL HEART RHYTHMS  . End stage renal disease  . SLEEP APNEA  . Low back pain radiating to right leg  . Anemia of chronic renal failure  . Obesity  . Diabetes mellitus    Past Medical History:  Past Medical History  Diagnosis Date  . Hypertension   . Diabetes mellitus   . Dialysis patient   . Back pain   . Dysrhythmia   . Angina     approx. 10 years ago  . Shortness of breath   . Renal failure     dialysis T, Th, Sa.  [address], Dr. Joline Maxcy  . Hiatal hernia   . Arthritis   . Sleep apnea     diagnosed approx 15 years,  has not used cpap machine in 4 years   Past Surgical History:  Past Surgical History  Procedure Date  . Av fistula placement   . Eye surgery     laser surgery 2 year ago bilaterally cataracts    OT Assessment/Plan/Recommendation OT Assessment Clinical Impression Statement: Pt will benefit from OT services in the acute care setting to increase I with ADLs and to increase I with functional transers in prep for d/c home with wife. OT Recommendation/Assessment: Patient will need skilled OT in the acute care venue OT Problem List: Decreased activity tolerance;Decreased knowledge of use of DME or AE;Decreased knowledge of precautions;Obesity;Pain OT Therapy Diagnosis : Acute pain OT Plan OT Frequency: Min 2X/week OT Treatment/Interventions: Self-care/ADL training;DME and/or AE instruction;Therapeutic activities;Patient/family education OT Recommendation Follow Up Recommendations: Home health OT Equipment Recommended: Rolling walker with 5" wheels (needs bariatric RW) Individuals Consulted Consulted and Agree with Results and Recommendations: Patient OT Goals Acute Rehab OT Goals OT Goal Formulation: With patient Time  For Goal Achievement: 7 days ADL Goals Pt Will Perform Grooming: with modified independence;Standing at sink;with adaptive equipment;Other (comment) (while maintaining back precautions) ADL Goal: Grooming - Progress: Other (comment) Pt Will Perform Lower Body Bathing: with 2+ total assist;Sit to stand from bed;with adaptive equipment;Other (comment) (while maintaining back precautions) ADL Goal: Lower Body Bathing - Progress: Other (comment) Pt Will Perform Lower Body Dressing: with modified independence;Sit to stand from bed;with adaptive equipment;Other (comment) (while maintaining back precautions) ADL Goal: Lower Body Dressing - Progress: Other (comment) Pt Will Transfer to Toilet: with modified independence;Stand pivot transfer;with DME;3-in-1;Maintaining back safety precautions ADL Goal: Toilet Transfer - Progress: Other (comment) Pt Will Perform Toileting - Clothing Manipulation: with modified independence;Standing;Other (comment) (while maintaining back precautions) ADL Goal: Toileting - Clothing Manipulation - Progress: Other (comment) Pt Will Perform Toileting - Hygiene: with modified independence;Sit to stand from 3-in-1/toilet;Other (comment) (while maintaining back precautions) ADL Goal: Toileting - Hygiene - Progress: Other (comment)  OT Evaluation Precautions/Restrictions  Precautions Precautions: Back Precaution Booklet Issued: Yes (comment) (back handout provided and posted) Prior Functioning Home Living Lives With: Spouse Receives Help From: Family Type of Home: House Home Layout: One level Home Access: Stairs to enter Entrance Stairs-Rails: Right;Left;Can reach both Secretary/administrator of Steps: 5 Bathroom Shower/Tub: Forensic scientist: Standard Home Adaptive Equipment: Quad cane Additional Comments: standard width RW at home Prior Function Level of Independence: Independent with basic ADLs;Needs assistance with gait (pt. uses w/c to enter  and leave HD) Able to Take Stairs?: Yes Driving: No Leisure: Hobbies-yes (  Comment) Comments: Gardening ADL ADL Eating/Feeding: Simulated Where Assessed - Eating/Feeding: Chair Grooming: Simulated;Set up Grooming Details (indicate cue type and reason): Setup to gather materials. Where Assessed - Grooming: Sitting, chair Upper Body Bathing: Simulated;Set up Upper Body Bathing Details (indicate cue type and reason): Setup to gather bathing materials. Where Assessed - Upper Body Bathing: Sitting, chair Lower Body Bathing: Simulated;Maximal assistance Lower Body Bathing Details (indicate cue type and reason): Pt unable to reach lower bilateral legs due to pain and back precautions. Pt unable to cross legs at this time. Where Assessed - Lower Body Bathing: Sitting, bed Upper Body Dressing: Performed;Set up Upper Body Dressing Details (indicate cue type and reason): Setup assist for snapping gown around IV lines. Where Assessed - Upper Body Dressing: Sitting, bed Lower Body Dressing: Simulated;Maximal assistance Lower Body Dressing Details (indicate cue type and reason): Pt unable to reach lower bilateral legs at this time due to pain and back precautions. Pt unable to cross legs to assist with LB dressing. Where Assessed - Lower Body Dressing: Sitting, bed Toilet Transfer: Simulated;+2 Total assistance;Comment for patient % (85%) Toilet Transfer Details (indicate cue type and reason): +2 assist for safety with lines and leads. VC's for sequencing, hand placement, and safe technique. Simulated toilet transfer from bed to chair. Toilet Transfer Method: Stand pivot Toileting - Clothing Manipulation: Simulated;Moderate assistance Where Assessed - Toileting Clothing Manipulation: Other (comment) (Simulated with sit to stand from bed.) Toileting - Hygiene: Simulated;Supervision/safety Toileting - Hygiene Details (indicate cue type and reason): Supervision to maintain back precautions. Where  Assessed - Toileting Hygiene: Other (comment) (Simulated while sitting on bed.) Tub/Shower Transfer: Not assessed Tub/Shower Transfer Method: Not assessed Equipment Used: Rolling walker ADL Comments: Pt with decreased I with ADLs due to pain and back precautions.   Vision/Perception    Cognition Cognition Arousal/Alertness: Awake/alert Overall Cognitive Status: Appears within functional limits for tasks assessed Orientation Level: Oriented X4 Sensation/Coordination Sensation Light Touch: Not tested Extremity Assessment RUE Assessment RUE Assessment: Within Functional Limits LUE Assessment LUE Assessment: Within Functional Limits Mobility  Bed Mobility Bed Mobility: Yes Rolling Left: 4: Min assist Rolling Left Details (indicate cue type and reason): for log rolling technique Left Sidelying to Sit: 1: +2 Total assist;HOB flat;Patient percentage (comment) (pt. 60%) Left Sidelying to Sit Details (indicate cue type and reason): vc's for technique and back precautions Sitting - Scoot to Edge of Bed: 5: Supervision Sitting - Scoot to Edge of Bed Details (indicate cue type and reason): Supervision for back precautions and safety. Sit to Supine - Left: Not tested (comment) Transfers Transfers: Yes Sit to Stand: 1: +2 Total assist;From elevated surface;From bed;Patient percentage (comment) (pt. 85%) Sit to Stand Details (indicate cue type and reason): cues for hand placement and safe technique Stand to Sit: 1: +2 Total assist;Patient percentage (comment) (pt. 85%) Stand to Sit Details: cues for hand placement Exercises   End of Session OT - End of Session Equipment Utilized During Treatment: Gait belt Activity Tolerance: Patient limited by fatigue Patient left: in chair;with call bell in reach;with family/visitor present General Behavior During Session: [facility] for tasks performed Cognition: [facility] for tasks performed   Cipriano Mile 02/04/2011, 12:13 PM   02/04/2011 Cipriano Mile OTR/L Pager [phone] Office [phone]

## 2011-02-04 NOTE — Progress Notes (Signed)
Ortho  Pt reports minimal back pain, no left leg pain  141/50 70 20, Afeb  NVI Dressing cdi Pt appears comfortable  POD #1 after left L3/4 TLIF doing well  Up with PT today D/c pca Oxycontin, percocet, valium Dialysis today? (per renal team) Likely d/c home tomorrow vs. sunday

## 2011-02-04 NOTE — Progress Notes (Signed)
Subjective: Interval History: none.  Objective: Vital signs in last 24 hours: Temp:  [98.4 F (36.9 C)-99 F (37.2 C)] 99 F (37.2 C) (11/16 0443) Pulse Rate:  [57-70] 70  (11/16 0443) Resp:  [17-44] 18  (11/16 0855) BP: (130-191)/(49-94) 130/50 mmHg (11/16 1147) SpO2:  [93 %-100 %] 97 % (11/16 0524) FiO2 (%):  [90 %] 90 % (11/16 1305) Weight:  [137.5 kg (303 lb 2.1 oz)] 303 lb 2.1 oz (137.5 kg) (11/15 2200) Weight change:   Intake/Output from previous day: 11/15 0701 - 11/16 0700 In: 1060 [I.V.:950; Blood:110] Out: 300 [Blood:300] Intake/Output this shift: Total I/O In: 240 [P.O.:240] Out: -   Physical Examination: General appearance - edematous, obese  Mental status - more awake/alert Eyes - funduscopic exam abnormal DM reatinopathy  Mouth - dental hygiene poor and carious dentition  Lymphatics - posterior cervical nodes  Chest - rales noted bibasilar, decreased air entry noted poor BS, decreased expansion  Heart - S1 and S2 normal, S4 present, systolic murmur GR @/6 at 2nd left intercostal space, PMI 12 cm lat to MSL. 1 plus edema  Abdomen - bowel sounds diminished  hepatomegaly down 6 cm  Obese  Extremities - pedal edema 1 plus +, peripheral pulses abnormal Decreased DP, trophic changes in feet, bilat fem bruits. LUA AVF  Skin - DRY, trophic changes LE  Lab Results:  [facility] 02/03/11 2120 02/03/11 1617 02/03/11 1055  WBC 7.9 -- 4.0  HGB 9.1* 10.5* --  HCT 30.2* 31.0* --  PLT 182 -- 197   BMET:  Basename 02/03/11 2120 02/03/11 1617 02/03/11 1055  NA 141 139 --  K 3.4* 4.1 --  CL 101 -- 102  CO2 30 -- 33*  GLUCOSE 177* 166* --  BUN 18 -- 14  CREATININE 5.26* -- 4.40*  CALCIUM 10.1 -- 9.9   No results found for this basename: PTH:2 in the last 72 hours Iron Studies: No results found for this basename: IRON,TIBC,TRANSFERRIN,FERRITIN in the last 72 hours  Studies/Results: Dg Lumbar Spine 2-3 Views  02/03/2011  *RADIOLOGY REPORT*  Clinical Data: Back  pain.   Renal failure. Diabetes.  LUMBAR SPINE - 2-3 VIEW  Comparison: MRI 12/12/2010.  CT 12/31/2010.  Findings: AP and lateral C-arm films document placement of L3-4 pedicle screw and rod fusion.  No adverse features.  IMPRESSION: As above.  Original Report Authenticated By: Elsie Stain, M.D.   Dg C-arm Gt 120 Min  02/03/2011  CLINICAL DATA: surgery   C-ARM GT 120 MIN  Fluoroscopy was utilized by the requesting physician.  No radiographic  interpretation.      Scheduled:   . allopurinol  300 mg Oral QHS  . aspirin EC  81 mg Oral Daily  . calcium acetate  667 mg Oral TID WC  . ceFAZolin (ANCEF) IV  1 g Intravenous Q8H  . ceFAZolin (ANCEF) IV  2 g Intravenous Q8H  . cinacalcet  30 mg Oral Q breakfast  . darbepoetin (ARANESP) injection - DIALYSIS  200 mcg Intravenous Q Fri-HD  . docusate sodium  100 mg Oral BID  . glipiZIDE  5 mg Oral Q breakfast  . insulin aspart  0-15 Units Subcutaneous Q4H  . lisinopril  30 mg Oral QHS  . metoprolol succinate  25 mg Oral QHS  . multivitamin  1 tablet Oral Daily  . oxyCODONE  10 mg Oral Q12H  . pantoprazole  20 mg Oral Q1200  . senna  1 tablet Oral BID AC  . sodium chloride  3 mL Intravenous Q12H  . DISCONTD: acetaminophen  1,000 mg Intravenous Q6H  . DISCONTD: albumin human  12.5 g Intravenous Once  . DISCONTD: aspirin  81 mg Oral Daily  . DISCONTD: calcium acetate (Phos Binder)  667 mg Oral TID WC  . DISCONTD: folic acid-vitamin b complex-vitamin c-selenium-zinc  1 tablet Oral Daily  . DISCONTD: lisinopril  30 mg Oral Daily  . DISCONTD: metoprolol tartrate  25 mg Oral Daily  . DISCONTD: morphine   Intravenous Q4H   Continuous:   . sodium chloride 250 mL (02/03/11 1930)  . sodium chloride 10 mL/hr at 02/03/11 2130  . 0.9 % NaCl with KCl 20 mEq / L 10 mL/hr at 02/03/11 2323  . DISCONTD: sodium chloride 50 mL/hr at 02/03/11 1329    Assessment/Plan: 1 S/P back surgery per Ortho  2 ESRD: will plan no Hep HD, get records. Pt seen on  hemodialysis and have adjusted as necessary 3 Hypertension: meds & vol  4. Anemia of ESRD: epo  5. Metabolic Bone Disease: vit D, Sensipar  6 Obesity  7OSA  8 DM monitor and adjust  9 PVD 10. Dispo per ortho    LOS: 1 day   Jeffrey Manning 02/04/2011,1:55 PM

## 2011-02-05 ENCOUNTER — Inpatient Hospital Stay (HOSPITAL_COMMUNITY): Payer: Medicare Other

## 2011-02-05 LAB — CBC
HCT: 26.2 % — ABNORMAL LOW (ref 39.0–52.0)
Hemoglobin: 7.9 g/dL — ABNORMAL LOW (ref 13.0–17.0)
MCH: 28.1 pg (ref 26.0–34.0)
MCHC: 30.2 g/dL (ref 30.0–36.0)
MCV: 93.2 fL (ref 78.0–100.0)
Platelets: 174 10*3/uL (ref 150–400)
RBC: 2.81 MIL/uL — ABNORMAL LOW (ref 4.22–5.81)
RDW: 17.1 % — ABNORMAL HIGH (ref 11.5–15.5)
WBC: 6.1 10*3/uL (ref 4.0–10.5)

## 2011-02-05 LAB — RENAL FUNCTION PANEL
Albumin: 2.6 g/dL — ABNORMAL LOW (ref 3.5–5.2)
BUN: 18 mg/dL (ref 6–23)
CO2: 29 mEq/L (ref 19–32)
Calcium: 9.1 mg/dL (ref 8.4–10.5)
Chloride: 101 mEq/L (ref 96–112)
Creatinine, Ser: 5.22 mg/dL — ABNORMAL HIGH (ref 0.50–1.35)
GFR calc Af Amer: 12 mL/min — ABNORMAL LOW (ref >90)
GFR calc non Af Amer: 11 mL/min — ABNORMAL LOW (ref >90)
Glucose, Bld: 94 mg/dL (ref 70–99)
Phosphorus: 2.7 mg/dL (ref 2.3–4.6)
Potassium: 3.6 mEq/L (ref 3.5–5.1)
Sodium: 138 mEq/L (ref 135–145)

## 2011-02-05 LAB — GLUCOSE, CAPILLARY
Glucose-Capillary: 49 mg/dL — ABNORMAL LOW (ref 70–99)
Glucose-Capillary: 62 mg/dL — ABNORMAL LOW (ref 70–99)

## 2011-02-05 MED ORDER — OXYCODONE HCL 5 MG PO TABS
5.0000 mg | ORAL_TABLET | ORAL | Status: DC | PRN
  Filled 2011-02-05: qty 1

## 2011-02-05 NOTE — Progress Notes (Signed)
Physical Therapy Treatment Patient Details Name: Jeffrey Manning MRN: [id number] DOB: [birth year] Today's Date: 02/05/2011  PT Assessment/Plan  PT - Assessment/Plan Comments on Treatment Session: Reviewed 3/3 back precautions. PT Plan: Discharge plan remains appropriate Follow Up Recommendations: Home health PT Equipment Recommended: Rolling walker with 5" wheels (needs bariatric walker) PT Goals  Acute Rehab PT Goals PT Goal: Supine/Side to Sit - Progress: Progressing toward goal PT Goal: Sit to Supine/Side - Progress: Progressing toward goal PT Transfer Goal: Sit to Stand/Stand to Sit - Progress: Progressing toward goal PT Goal: Ambulate - Progress: Progressing toward goal  PT Treatment Precautions/Restrictions  Precautions Precautions: Back Precaution Booklet Issued: Yes (comment) (back handout provided and posted) Required Braces or Orthoses: No Restrictions Weight Bearing Restrictions: No Mobility (including Balance) Bed Mobility Bed Mobility: Yes Sit to Supine - Right: 3: Mod assist Sit to Supine - Right Details (indicate cue type and reason): verbal/tactile cues for sequencing and precautions Transfers Sit to Stand: 1: +2 Total assist;From chair/3-in-1;With armrests;Patient percentage (comment) (pt 50%) Sit to Stand Details (indicate cue type and reason): verbal/tactile cues for technique, hand placement, precautions Stand to Sit: 4: Min assist Stand to Sit Details: verbal cues for sequencing Ambulation/Gait Ambulation/Gait Assistance: 4: Min assist Ambulation/Gait Assistance Details (indicate cue type and reason): cues to stay inside RW, and erect posture Ambulation Distance (Feet): 130 Feet Assistive device: Rolling walker Gait velocity: slow cadence    Exercise    End of Session PT - End of Session Equipment Utilized During Treatment: Gait belt Activity Tolerance: Patient tolerated treatment well Patient left: in bed;with call bell in reach;with  family/visitor present General Behavior During Session: [facility] for tasks performed Cognition: [facility] for tasks performed  Ilda Foil 02/05/2011, 11:20 AM

## 2011-02-05 NOTE — Progress Notes (Signed)
Ortho  Pt reports minimal back pain, no left leg pain  AVSS NVI  Dressing cdi  Pt appears comfortable   POD #2 after left L3/4 TLIF doing well   Up with PT today  Oxycontin, percocet (10s), valium  Likely d/c home tomorrow

## 2011-02-05 NOTE — Progress Notes (Signed)
Subjective: Interval History: No complaints.  Has been walking in the room.  No BM, chronic problem with constipation, takes multiple laxatives at home.   Objective: Vital signs in last 24 hours: Temp:  [98.9 F (37.2 C)-100.6 F (38.1 C)] 98.9 F (37.2 C) (11/17 1040) Pulse Rate:  [56-82] 67  (11/17 1233) Resp:  [18-20] 18  (11/17 1233) BP: (89-178)/(40-72) 89/52 mmHg (11/17 1233) SpO2:  [96 %-100 %] 98 % (11/17 1040) Weight:  [130.9 kg (288 lb 9.3 oz)-133.7 kg (294 lb 12.1 oz)] 292 lb 5.3 oz (132.6 kg) (11/17 1040) Weight change: -3.8 kg (-8 lb 6 oz)  Intake/Output from previous day: 11/16 0701 - 11/17 0700 In: 240 [P.O.:240] Out: 2572 [Urine:100] Intake/Output this shift: Total I/O In: 240 [P.O.:240] Out: -   Exam Gen:  Alert, ox3 Chest:  Clear bilat Cor:  Reg w/o m/r/g Abd:  Obese, protuberant, nontender Ext:  Trace LE edema, chronic brawny skin changes  Lab Results:  Basename 02/05/11 1250 02/04/11 1430  WBC 6.1 5.2  HGB 7.9* 8.3*  HCT 26.2* 27.6*  PLT 174 133*   BMET:   Basename 02/04/11 1430 02/03/11 2120  NA 140 141  K 3.7 3.4*  CL 102 101  CO2 30 30  GLUCOSE 199* 177*  BUN 24* 18  CREATININE 6.14* 5.26*  CALCIUM 9.8 10.1   No results found for this basename: PTH:2 in the last 72 hours Iron Studies: No results found for this basename: IRON,TIBC,TRANSFERRIN,FERRITIN in the last 72 hours  Studies/Results: Dg Lumbar Spine 2-3 Views  02/03/2011  *RADIOLOGY REPORT*  Clinical Data: Back pain.   Renal failure. Diabetes.  LUMBAR SPINE - 2-3 VIEW  Comparison: MRI 12/12/2010.  CT 12/31/2010.  Findings: AP and lateral C-arm films document placement of L3-4 pedicle screw and rod fusion.  No adverse features.  IMPRESSION: As above.  Original Report Authenticated By: Elsie Stain, M.D.   Dg C-arm Gt 120 Min  02/03/2011  CLINICAL DATA: surgery   C-ARM GT 120 MIN  Fluoroscopy was utilized by the requesting physician.  No radiographic  interpretation.      Assessment/Plan: 1 S/P back surgery per Ortho  2 ESRD: short HD today to get back on schedule of TTS. 3 Hypertension: below EDW, BP low.  Keep even. 4. Anemia of ESRD: epo  5. Metabolic Bone Disease: vit D, Sensipar  6 Obesity  7OSA  8 DM monitor and adjust  9 PVD 10. Dispo per ortho     LOS: 2 days   Jeffrey Manning D 02/05/2011,1:11 PM

## 2011-02-06 LAB — GLUCOSE, CAPILLARY
Glucose-Capillary: 39 mg/dL — CL (ref 70–99)
Glucose-Capillary: 53 mg/dL — ABNORMAL LOW (ref 70–99)

## 2011-02-06 MED ORDER — OXYCODONE HCL 10 MG PO TB12: 10.0000 mg | ORAL_TABLET | Freq: Two times a day (BID) | ORAL | Status: AC

## 2011-02-06 NOTE — Progress Notes (Signed)
Case Management:   02/06/11 1143 Tera Mater, RN, BSN Case Manager [phone] Spoke with [facility] to arrange wide rolling walker and to let know of discharge today.

## 2011-02-06 NOTE — Progress Notes (Signed)
Subjective: No current complaints, moving cautiously post-surgery.  Objective: Vital signs in last 24 hours: Temp:  [98.4 F (36.9 C)-98.8 F (37.1 C)] 98.8 F (37.1 C) (11/17 2110) Pulse Rate:  [63-70] 70  (11/17 2110) Resp:  [18-20] 20  (11/17 2110) BP: (82-138)/(44-54) 138/54 mmHg (11/17 2110) SpO2:  [98 %-100 %] 99 % (11/17 2110) Weight:  [132.4 kg (291 lb 14.2 oz)] 291 lb 14.2 oz (132.4 kg) (11/17 1417) Weight change: -1.1 kg (-2 lb 6.8 oz)  Intake/Output from previous day: 11/17 0701 - 11/18 0700 In: 1080 [P.O.:1080] Out: -142    EXAM: General appearance:  Alert ,in no apparent distress. Resp:  CTA B. Cardio:  RRR without murmur. GI:  + BS, soft and nontender. Extremities:  No edema. Access:  AVF @ [facility].  Lab Results:  Basename 02/05/11 1250 02/04/11 1430  WBC 6.1 5.2  HGB 7.9* 8.3*  HCT 26.2* 27.6*  PLT 174 133*   BMET:  Basename 02/05/11 1250 02/04/11 1430  NA 138 140  K 3.6 3.7  CL 101 102  CO2 29 30  GLUCOSE 94 199*  BUN 18 24*  CREATININE 5.22* 6.14*  CALCIUM 9.1 9.8  ALBUMIN 2.6* 2.9*   No results found for this basename: PTH:2 in the last 72 hours Iron Studies: No results found for this basename: IRON,TIBC,TRANSFERRIN,FERRITIN in the last 72 hours  Assessment/Plan: 1.  S/p lower back surgery - per Dr. Yevette Edwards on 11/15. 2.  ESRD - on TTS @[facility] , stable s/p HD yesterday, K stable @ 3.6. 3.  HTN - BP currently stable. 4.  Anemia - Hgb fell to 7.9, s/p Aranesp 200 mcg on Fri. Will check Hb at next outpt dialysis. 5.  Metabolic Bone Disease - on Zemplar, Sensipar, and Phoslo.  6.  DM - on Glipizide and SSI. 7.  PVD 8.  OSA  Jeffrey Manning,Jeffrey Manning 02/06/2011,12:29 PM      Patient seen and examined- I agree with assess/plan as outlined above.  For d/c today.  Will obtain CBC at next HD to follow up anemia after back surgery.  Vinson Moselle, MD / Renal Attending  Cell # [phone]     02/06/2011,  4:30 PM

## 2011-02-06 NOTE — Progress Notes (Signed)
Ortho   Pt reports minimal back pain, no left leg pain  AVSS   NVI  Dressing cdi  Pt appears comfortable   BP 138/54  Pulse 70  Temp(Src) 98.8 F (37.1 C) (Oral)  Resp 20  Ht 5\' 10"  (1.778 m)  Wt 132.4 kg (291 lb 14.2 oz)  BMI 41.88 kg/m2  SpO2 99%   POD #3 after left L3/4 TLIF doing well, intermittent low blood glucose  Oxycontin, percocet (10s), valium  d/c home today Pt instructed to contact PCP regarding DM management

## 2011-02-06 NOTE — Discharge Summary (Signed)
NAMECORDEL, DREWES NO.:  [id number]  MEDICAL RECORD NO.:  [id number]  LOCATION:  5006                         FACILITY:  [facility]  PHYSICIAN:  Estill Bamberg, MD      DATE OF BIRTH:  [birth year]  DATE OF ADMISSION:  02/03/2011 DATE OF DISCHARGE:  02/06/2011                              DISCHARGE SUMMARY   ADMISSION DIAGNOSES: 1. Right-sided L4 radiculopathy. 2. Severe L3-4 degenerative disk disease and severe neural foraminal     stenosis.  ADMITTING PHYSICIAN:  Estill Bamberg, MD  ADMISSION HISTORY:  Briefly, Mr. Mallon is a pleasant 65 year old male who does carry a medical history related to diabetes as well as renal failure.  The patient did have severe debilitating pain in the right leg.  An MRI was notable for severe profound neural foraminal stenosis at the L3-4 level bilaterally.  We did go forward with conservative care, but he did continue to have ongoing and debilitating pain.  He therefore was admitted on February 03, 2011, for a right-sided L3-4 transforaminal lumbar interbody fusion with a contralateral left-sided posterolateral fusion with instrumentation.  HOSPITAL COURSE:  On February 03, 2011, the patient brought to surgery and underwent the procedure noted above.  The patient tolerated the procedure well and was transferred to recovery in stable condition.  The patient was mobilized progressively throughout his hospital stay with Physical Therapy.  Of note, Dr. Arrie Aran, from Renal did evaluate the patient.  The patient was dialyzed on postoperative day #1.  The patient otherwise remained stable throughout his hospital course.  Of note, the patient's right lower extremity pain was entirely resolved throughout his hospital stay.  His back pain is minimal.  He was neurovascularly intact and  incision was noted to be clean, dry, and intact throughout his hospital stay.  On the morning of postoperative day #3, the patient's pain was well  controlled, and the patient was  ambulating throughout the halls.  The patient was therefore discharged home uneventfully on the morning of postoperative day #3.  DISCHARGE INSTRUCTIONS:  The patient will take Percocet for pain and Valium for spasms.  The patient will follow up in my office in approximately 2 weeks for a repeat evaluation of his wound and likely removal of his Vicryl stitch.  He will take OxyContin, 10 mg b.i.d. as well as Percocet and Valium.     Estill Bamberg, MD     MD/MEDQ  D:  02/06/2011  T:  02/06/2011  Job:  [id number]

## 2011-02-07 MED FILL — Heparin Sodium (Porcine) Inj 1000 Unit/ML: INTRAMUSCULAR | Qty: 1 | Status: AC

## 2011-02-07 MED FILL — Sodium Chloride IV Soln 0.9%: INTRAVENOUS | Qty: 1000 | Status: AC

## 2011-02-07 MED FILL — Sodium Chloride Irrigation Soln 0.9%: Qty: 3000 | Status: AC

## 2011-02-08 NOTE — Transfer of Care (Signed)
Immediate Anesthesia Transfer of Care Note  Patient: Jeffrey Manning  Procedure(s) Performed:  POSTERIOR LUMBAR FUSION 1 LEVEL - Right side lumbar transforminal interbody fusion L3-4 with instrumentation autograft insertion of prosthetic device  Patient Location: PACU  Anesthesia Type: General  Level of Consciousness: awake, alert  and oriented  Airway & Oxygen Therapy: Patient Spontanous Breathing  Post-op Assessment: Report given to PACU RN and Post -op Vital signs reviewed and stable  Post vital signs: Reviewed and stable  Complications: No apparent anesthesia complications

## 2012-03-19 ENCOUNTER — Encounter (HOSPITAL_COMMUNITY): Payer: Self-pay | Admitting: Pharmacy Technician

## 2012-03-19 ENCOUNTER — Other Ambulatory Visit (HOSPITAL_COMMUNITY): Payer: Self-pay | Admitting: Nephrology

## 2012-03-19 DIAGNOSIS — N186 End stage renal disease: Secondary | ICD-10-CM

## 2012-03-20 ENCOUNTER — Other Ambulatory Visit (HOSPITAL_COMMUNITY): Payer: Self-pay | Admitting: Nephrology

## 2012-03-20 ENCOUNTER — Ambulatory Visit (HOSPITAL_COMMUNITY)
Admission: RE | Admit: 2012-03-20 | Discharge: 2012-03-20 | Disposition: A | Discharge status: Home / Self Care | Payer: Medicare Other | Source: Ambulatory Visit | Attending: Nephrology | Admitting: Nephrology

## 2012-03-20 ENCOUNTER — Encounter (HOSPITAL_COMMUNITY): Payer: Self-pay

## 2012-03-20 DIAGNOSIS — Y832 Surgical operation with anastomosis, bypass or graft as the cause of abnormal reaction of the patient, or of later complication, without mention of misadventure at the time of the procedure: Secondary | ICD-10-CM | POA: Insufficient documentation

## 2012-03-20 DIAGNOSIS — I120 Hypertensive chronic kidney disease with stage 5 chronic kidney disease or end stage renal disease: Secondary | ICD-10-CM | POA: Insufficient documentation

## 2012-03-20 DIAGNOSIS — E119 Type 2 diabetes mellitus without complications: Secondary | ICD-10-CM | POA: Insufficient documentation

## 2012-03-20 DIAGNOSIS — I871 Compression of vein: Secondary | ICD-10-CM | POA: Insufficient documentation

## 2012-03-20 DIAGNOSIS — T82898A Other specified complication of vascular prosthetic devices, implants and grafts, initial encounter: Secondary | ICD-10-CM | POA: Insufficient documentation

## 2012-03-20 DIAGNOSIS — N186 End stage renal disease: Secondary | ICD-10-CM

## 2012-03-20 DIAGNOSIS — Z992 Dependence on renal dialysis: Secondary | ICD-10-CM | POA: Insufficient documentation

## 2012-03-20 LAB — GLUCOSE, CAPILLARY

## 2012-03-20 LAB — POCT I-STAT 4, (NA,K, GLUC, HGB,HCT)
Hemoglobin: 15 g/dL (ref 13.0–17.0)
Potassium: 5.6 mEq/L — ABNORMAL HIGH (ref 3.5–5.1)

## 2012-03-20 MED ORDER — IOHEXOL 300 MG/ML  SOLN
150.0000 mL | Freq: Once | INTRAMUSCULAR | Status: AC | PRN
  Administered 2012-03-20: 70 mL via INTRAVENOUS

## 2012-03-20 MED ORDER — FENTANYL CITRATE 0.05 MG/ML IJ SOLN
INTRAMUSCULAR | Status: AC
  Filled 2012-03-20: qty 4

## 2012-03-20 MED ORDER — FENTANYL CITRATE 0.05 MG/ML IJ SOLN
INTRAMUSCULAR | Status: AC | PRN
  Administered 2012-03-20 (×2): 25 ug via INTRAVENOUS

## 2012-03-20 MED ORDER — ALTEPLASE 100 MG IV SOLR
INTRAVENOUS | Status: AC | PRN
  Administered 2012-03-20: 2 mg

## 2012-03-20 MED ORDER — MIDAZOLAM HCL 2 MG/2ML IJ SOLN
INTRAMUSCULAR | Status: AC
  Filled 2012-03-20: qty 4

## 2012-03-20 MED ORDER — HEPARIN SODIUM (PORCINE) 1000 UNIT/ML IJ SOLN
INTRAMUSCULAR | Status: AC | PRN
  Administered 2012-03-20: 3000 [IU] via INTRAVENOUS

## 2012-03-20 MED ORDER — HEPARIN SODIUM (PORCINE) 1000 UNIT/ML IJ SOLN
INTRAMUSCULAR | Status: AC
  Filled 2012-03-20: qty 1

## 2012-03-20 MED ORDER — MIDAZOLAM HCL 2 MG/2ML IJ SOLN
INTRAMUSCULAR | Status: AC | PRN
  Administered 2012-03-20: 1 mg via INTRAVENOUS

## 2012-03-20 MED ORDER — ALTEPLASE 2 MG IJ SOLR
2.0000 mg | Freq: Once | INTRAMUSCULAR | Status: DC
  Filled 2012-03-20: qty 2

## 2012-03-20 NOTE — Procedures (Signed)
Procedure:  Left arm AV fistula declot with angioplasty Findings:  Cephalic vein stenoses treated with angioplasty.  Clot removed with Angiojet device.  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  Good flow on completion.

## 2012-03-20 NOTE — H&P (Signed)
HPI: Jeffrey Manning is an 66 y.o. male with ESRD who has had a functional (L)UE AVF for several years. It has now become clotted. He last had HD about 4 days ago but feels fine. He is referred for declot procedure. PMHx and meds reviewed  Past Medical History:  Past Medical History  Diagnosis Date  . Hypertension   . Diabetes mellitus   . Dialysis patient   . Back pain   . Dysrhythmia   . Angina     approx. 10 years ago  . Shortness of breath   . Renal failure     dialysis T, Th, Sa.  [address], Dr. Joline Maxcy  . Hiatal hernia   . Arthritis   . Sleep apnea     diagnosed approx 15 years,  has not used cpap machine in 4 years    Past Surgical History:  Past Surgical History  Procedure Date  . Av fistula placement   . Eye surgery     laser surgery 2 year ago bilaterally cataracts    Family History: No family history on file.  Social History:  reports that he has quit smoking. His smoking use included Cigarettes. He quit after 20 years of use. He does not have any smokeless tobacco history on file. He reports that he drinks alcohol. He reports that he does not use illicit drugs.  Allergies:  Allergies  Allergen Reactions  . Iohexol Nausea And Vomiting    Medications: allopurinol (ZYLOPRIM) 100 MG tablet (Taking) Sig - Route: Take 100 mg by mouth daily. - Oral Class: Historical Med Number of times this order has been changed since signing: 1 Order [address] atorvastatin (LIPITOR) 40 MG tablet (Taking) Sig - Route: Take 40 mg by mouth daily. - Oral Class: Historical Med Number of times this order has been changed since signing: 1 Order [address] calcium acetate, Phos Binder, (PHOSLYRA) 667 MG/5ML SOLN (Taking) Sig - Route: Take 667 mg by mouth 3 (three) times daily with meals. - Oral Class: Historical Med Number of times this order has been changed since signing: 1 Order [address] cinacalcet (SENSIPAR) 30 MG tablet (Taking) Sig - Route: Take 30 mg by mouth daily. - Oral  Class: Historical Med Number of times this order has been changed since signing: 1 Order [address] folic acid-vitamin b complex-vitamin c-selenium-zinc (DIALYVITE) 3 MG TABS (Taking) Sig - Route: Take 1 tablet by mouth daily. - Oral Class: Historical Med Number of times this order has been changed since signing: 1 Order [address] glipiZIDE (GLUCOTROL XL) 5 MG 24 hr tablet (Taking) Sig - Route: Take 5 mg by mouth daily. - Oral Class: Historical Med Number of times this order has been changed since signing: 1 Order [address] lansoprazole (PREVACID) 30 MG capsule (Taking) Sig - Route: Take 30 mg by mouth daily. - Oral Class: Historical Med Number of times this order has been changed since signing: 1 Order [address] lisinopril (PRINIVIL,ZESTRIL) 30 MG tablet (Taking) Sig - Route: Take 30 mg by mouth daily. - Oral Class: Historical Med metoprolol tartrate (LOPRESSOR) 25 MG tablet (Taking) Sig - Route: Take 25 mg by mouth daily. - Oral Class: Historical Med OVER THE COUNTER MEDICATION (Taking) Sig - Route: Take 1 tablet by mouth daily. Equate Allergy medication. - Oral Class: Historical Med Number of times this order has been changed since signing: 1 Order [address] oxyCODONE-acetaminophen (PERCOCET) 5-325 MG per tablet (Taking) Sig - Route: Take 1 tablet by mouth every  6 (six) hours as needed. For pain - Oral Class: Historical Med   Please HPI for pertinent positives, otherwise complete 10 system ROS negative.  Physical Exam: Blood pressure 149/56, pulse 80, temperature 98.3 F (36.8 C), temperature source Oral, resp. rate 16, SpO2 97.00%. There is no height or weight on file to calculate BMI.   General Appearance:  Alert, cooperative, no distress, appears stated age  Head:  Normocephalic, without obvious abnormality, atraumatic  ENT: Unremarkable  Neck: Supple, symmetrical, trachea midline, no adenopathy, thyroid: not enlarged, symmetric, no tenderness/mass/nodules  Lungs:   Clear to auscultation  bilaterally, no w/r/r, respirations unlabored without use of accessory muscles.  Chest Wall:  No tenderness or deformity  Heart:  Regular rate and rhythm, S1, S2 normal, no murmur, rub or gallop. Carotids 2+ without bruit.  Extremities: (L)UE AVF with aneurysmal development. Distal aspect of fistula with good pulse. More proximal part is firm without pulse c/w clot., NT  Pulses: 2+ and symmetric  Neurologic: Normal affect, no gross deficits.   No results found for this or any previous visit (from the past 48 hour(s)). No results found.  Assessment/Plan ESRD Thrombosed (L)UE AVF For thrombectomy/lysis, possible angioplasty, possible, temp HD cath. Procedure explained in detail including risks. Pt has had had pre-meds regimen for contrast allergy. Consent signed in chart  Brayton El PA-C 03/20/2012, 8:03 AM

## 2012-03-20 NOTE — H&P (Signed)
Agree 

## 2012-03-20 NOTE — ED Notes (Signed)
Pt got 1mg  Versed during procedure- phyxis says wasted 2mg - only wasted 1 mg with Justice Rocher, RN

## 2012-03-20 NOTE — ED Notes (Signed)
Spoke with Victorino Dike at dialysis center- per MD Fredia Sorrow- try to avoid sticking aneurysms when pt comes for dialysis.

## 2012-03-22 ENCOUNTER — Inpatient Hospital Stay (HOSPITAL_COMMUNITY): Payer: Medicare Other

## 2012-03-22 ENCOUNTER — Encounter (HOSPITAL_COMMUNITY): Payer: Self-pay | Admitting: Certified Registered Nurse Anesthetist

## 2012-03-22 ENCOUNTER — Ambulatory Visit (HOSPITAL_COMMUNITY)
Admission: AD | Admit: 2012-03-22 | Discharge: 2012-03-22 | DRG: 314 | Disposition: A | Discharge status: Home / Self Care | Payer: Medicare Other | Source: Ambulatory Visit | Attending: Vascular Surgery | Admitting: Vascular Surgery

## 2012-03-22 ENCOUNTER — Inpatient Hospital Stay (HOSPITAL_COMMUNITY): Payer: Medicare Other | Admitting: Certified Registered Nurse Anesthetist

## 2012-03-22 ENCOUNTER — Other Ambulatory Visit: Payer: Self-pay | Admitting: *Deleted

## 2012-03-22 ENCOUNTER — Encounter (HOSPITAL_COMMUNITY)
Admission: AD | Disposition: A | Discharge status: Home / Self Care | Payer: Self-pay | Source: Ambulatory Visit | Attending: Vascular Surgery

## 2012-03-22 ENCOUNTER — Encounter (HOSPITAL_COMMUNITY): Payer: Self-pay | Admitting: *Deleted

## 2012-03-22 DIAGNOSIS — E119 Type 2 diabetes mellitus without complications: Secondary | ICD-10-CM | POA: Insufficient documentation

## 2012-03-22 DIAGNOSIS — M549 Dorsalgia, unspecified: Secondary | ICD-10-CM | POA: Insufficient documentation

## 2012-03-22 DIAGNOSIS — N186 End stage renal disease: Secondary | ICD-10-CM | POA: Insufficient documentation

## 2012-03-22 DIAGNOSIS — Z992 Dependence on renal dialysis: Secondary | ICD-10-CM | POA: Insufficient documentation

## 2012-03-22 DIAGNOSIS — I120 Hypertensive chronic kidney disease with stage 5 chronic kidney disease or end stage renal disease: Secondary | ICD-10-CM | POA: Insufficient documentation

## 2012-03-22 DIAGNOSIS — G4730 Sleep apnea, unspecified: Secondary | ICD-10-CM | POA: Insufficient documentation

## 2012-03-22 HISTORY — DX: Other specified postprocedural states: Z98.890

## 2012-03-22 HISTORY — PX: INSERTION OF DIALYSIS CATHETER: SHX1324

## 2012-03-22 HISTORY — DX: Other specified postprocedural states: R11.2

## 2012-03-22 LAB — POCT I-STAT 4, (NA,K, GLUC, HGB,HCT)
Glucose, Bld: 66 mg/dL — ABNORMAL LOW (ref 70–99)
HCT: 45 % (ref 39.0–52.0)
Hemoglobin: 15.3 g/dL (ref 13.0–17.0)
Potassium: 4.8 meq/L (ref 3.5–5.1)
Sodium: 136 meq/L (ref 135–145)

## 2012-03-22 LAB — GLUCOSE, CAPILLARY: Glucose-Capillary: 61 mg/dL — ABNORMAL LOW (ref 70–99)

## 2012-03-22 LAB — SURGICAL PCR SCREEN: MRSA, PCR: POSITIVE — AB

## 2012-03-22 SURGERY — INSERTION OF DIALYSIS CATHETER
Anesthesia: General | Site: Neck | Laterality: Left | Wound class: Clean

## 2012-03-22 MED ORDER — THROMBIN 20000 UNITS EX SOLR
CUTANEOUS | Status: AC
  Filled 2012-03-22: qty 20000

## 2012-03-22 MED ORDER — ACETAMINOPHEN 10 MG/ML IV SOLN: 1000.0000 mg | Freq: Once | INTRAVENOUS | Status: DC | PRN

## 2012-03-22 MED ORDER — GLUCOSE 40 % PO GEL
ORAL | Status: AC
  Administered 2012-03-22: 1 via ORAL
  Filled 2012-03-22: qty 1

## 2012-03-22 MED ORDER — OXYCODONE HCL 5 MG PO TABS: 5.0000 mg | ORAL_TABLET | ORAL | Status: DC | PRN

## 2012-03-22 MED ORDER — LIDOCAINE HCL (PF) 1 % IJ SOLN
INTRAMUSCULAR | Status: AC
  Filled 2012-03-22: qty 30

## 2012-03-22 MED ORDER — MIDAZOLAM HCL 5 MG/5ML IJ SOLN
INTRAMUSCULAR | Status: DC | PRN
  Administered 2012-03-22 (×2): 1 mg via INTRAVENOUS

## 2012-03-22 MED ORDER — MUPIROCIN 2 % EX OINT
TOPICAL_OINTMENT | CUTANEOUS | Status: AC
  Administered 2012-03-22: 1 via NASAL
  Filled 2012-03-22: qty 22

## 2012-03-22 MED ORDER — ONDANSETRON HCL 4 MG/2ML IJ SOLN: 4.0000 mg | Freq: Once | INTRAMUSCULAR | Status: DC | PRN

## 2012-03-22 MED ORDER — SODIUM CHLORIDE 0.9 % IV SOLN
INTRAVENOUS | Status: DC
  Administered 2012-03-22: 14:00:00 via INTRAVENOUS

## 2012-03-22 MED ORDER — OXYCODONE HCL 5 MG PO TABS
5.0000 mg | ORAL_TABLET | Freq: Once | ORAL | Status: AC | PRN
  Administered 2012-03-22: 5 mg via ORAL

## 2012-03-22 MED ORDER — GLUCOSE 40 % PO GEL
1.0000 | Freq: Once | ORAL | Status: DC
  Filled 2012-03-22: qty 1.65

## 2012-03-22 MED ORDER — SODIUM CHLORIDE 0.9 % IV SOLN: INTRAVENOUS | Status: DC

## 2012-03-22 MED ORDER — PHENYLEPHRINE HCL 10 MG/ML IJ SOLN
INTRAMUSCULAR | Status: DC | PRN
  Administered 2012-03-22 (×3): 80 ug via INTRAVENOUS

## 2012-03-22 MED ORDER — OXYCODONE HCL 5 MG/5ML PO SOLN: 5.0000 mg | Freq: Once | ORAL | Status: AC | PRN

## 2012-03-22 MED ORDER — 0.9 % SODIUM CHLORIDE (POUR BTL) OPTIME
TOPICAL | Status: DC | PRN
  Administered 2012-03-22: 1000 mL

## 2012-03-22 MED ORDER — HEPARIN SODIUM (PORCINE) 1000 UNIT/ML IJ SOLN
INTRAMUSCULAR | Status: AC
  Filled 2012-03-22: qty 1

## 2012-03-22 MED ORDER — DEXTROSE 50 % IV SOLN
INTRAVENOUS | Status: AC
  Filled 2012-03-22: qty 50

## 2012-03-22 MED ORDER — FENTANYL CITRATE 0.05 MG/ML IJ SOLN
INTRAMUSCULAR | Status: DC | PRN
  Administered 2012-03-22 (×3): 50 ug via INTRAVENOUS

## 2012-03-22 MED ORDER — OXYCODONE HCL 5 MG PO TABS
ORAL_TABLET | ORAL | Status: AC
  Filled 2012-03-22: qty 1

## 2012-03-22 MED ORDER — HEPARIN SODIUM (PORCINE) 1000 UNIT/ML IJ SOLN
INTRAMUSCULAR | Status: DC | PRN
  Administered 2012-03-22: 4.6 mL

## 2012-03-22 MED ORDER — DEXTROSE 50 % IV SOLN
INTRAVENOUS | Status: DC | PRN
  Administered 2012-03-22: 12.5 g via INTRAVENOUS

## 2012-03-22 MED ORDER — SODIUM CHLORIDE 0.9 % IR SOLN
Status: DC | PRN
  Administered 2012-03-22: 14:00:00

## 2012-03-22 MED ORDER — PROPOFOL 10 MG/ML IV BOLUS
INTRAVENOUS | Status: DC | PRN
  Administered 2012-03-22: 150 mg via INTRAVENOUS
  Administered 2012-03-22 (×2): 50 mg via INTRAVENOUS

## 2012-03-22 MED ORDER — SODIUM CHLORIDE 0.9 % IV SOLN
INTRAVENOUS | Status: DC | PRN
  Administered 2012-03-22: 14:00:00 via INTRAVENOUS

## 2012-03-22 MED ORDER — DEXTROSE 5 % IV SOLN
1.5000 g | INTRAVENOUS | Status: AC
  Administered 2012-03-22: 1.5 g via INTRAVENOUS
  Filled 2012-03-22 (×2): qty 1.5

## 2012-03-22 MED ORDER — HYDROMORPHONE HCL PF 1 MG/ML IJ SOLN: 0.2500 mg | INTRAMUSCULAR | Status: DC | PRN

## 2012-03-22 SURGICAL SUPPLY — 72 items
ADH SKN CLS APL DERMABOND .7 (GAUZE/BANDAGES/DRESSINGS) ×2
BAG DECANTER FOR FLEXI CONT (MISCELLANEOUS) ×3 IMPLANT
CANISTER SUCTION 2500CC (MISCELLANEOUS) ×3 IMPLANT
CATH CANNON HEMO 15F 50CM (CATHETERS) IMPLANT
CATH CANNON HEMO 15FR 19 (HEMODIALYSIS SUPPLIES) IMPLANT
CATH CANNON HEMO 15FR 23CM (HEMODIALYSIS SUPPLIES) ×2 IMPLANT
CATH CANNON HEMO 15FR 31CM (HEMODIALYSIS SUPPLIES) IMPLANT
CATH CANNON HEMO 15FR 32 (HEMODIALYSIS SUPPLIES) IMPLANT
CATH CANNON HEMO 15FR 32CM (HEMODIALYSIS SUPPLIES) IMPLANT
CATH EMB 4FR 80CM (CATHETERS) ×3 IMPLANT
CATH HEADHUNTER 5FR 65CM (MISCELLANEOUS) ×2 IMPLANT
CHLORAPREP W/TINT 26ML (MISCELLANEOUS) ×3 IMPLANT
CLIP TI MEDIUM 6 (CLIP) ×3 IMPLANT
CLIP TI WIDE RED SMALL 6 (CLIP) ×3 IMPLANT
CLOTH BEACON ORANGE TIMEOUT ST (SAFETY) ×3 IMPLANT
COVER PROBE W GEL 5X96 (DRAPES) ×2 IMPLANT
COVER SURGICAL LIGHT HANDLE (MISCELLANEOUS) ×3 IMPLANT
DECANTER SPIKE VIAL GLASS SM (MISCELLANEOUS) ×3 IMPLANT
DERMABOND ADVANCED (GAUZE/BANDAGES/DRESSINGS) ×1
DERMABOND ADVANCED .7 DNX12 (GAUZE/BANDAGES/DRESSINGS) ×2 IMPLANT
DRAPE C-ARM 42X72 X-RAY (DRAPES) ×3 IMPLANT
DRAPE CHEST BREAST 15X10 FENES (DRAPES) ×3 IMPLANT
DRAPE X-RAY CASS 24X20 (DRAPES) IMPLANT
ELECT REM PT RETURN 9FT ADLT (ELECTROSURGICAL) ×3
ELECTRODE REM PT RTRN 9FT ADLT (ELECTROSURGICAL) ×2 IMPLANT
GAUZE SPONGE 2X2 8PLY STRL LF (GAUZE/BANDAGES/DRESSINGS) ×2 IMPLANT
GAUZE SPONGE 4X4 16PLY XRAY LF (GAUZE/BANDAGES/DRESSINGS) ×3 IMPLANT
GEL ULTRASOUND 20GR AQUASONIC (MISCELLANEOUS) IMPLANT
GLOVE BIO SURGEON STRL SZ7.5 (GLOVE) ×3 IMPLANT
GLOVE BIOGEL PI IND STRL 6.5 (GLOVE) ×2 IMPLANT
GLOVE BIOGEL PI IND STRL 7.0 (GLOVE) ×2 IMPLANT
GLOVE BIOGEL PI IND STRL 7.5 (GLOVE) ×1 IMPLANT
GLOVE BIOGEL PI INDICATOR 6.5 (GLOVE) ×2
GLOVE BIOGEL PI INDICATOR 7.0 (GLOVE) ×2
GLOVE BIOGEL PI INDICATOR 7.5 (GLOVE) ×1
GLOVE ECLIPSE 6.5 STRL STRAW (GLOVE) ×2 IMPLANT
GLOVE SS BIOGEL STRL SZ 7 (GLOVE) ×1 IMPLANT
GLOVE SUPERSENSE BIOGEL SZ 7 (GLOVE) ×1
GOWN PREVENTION PLUS XLARGE (GOWN DISPOSABLE) ×3 IMPLANT
GOWN STRL NON-REIN LRG LVL3 (GOWN DISPOSABLE) ×8 IMPLANT
KIT BASIN OR (CUSTOM PROCEDURE TRAY) ×3 IMPLANT
KIT ROOM TURNOVER OR (KITS) ×3 IMPLANT
LOOP VESSEL MINI RED (MISCELLANEOUS) IMPLANT
NDL 18GX1X1/2 (RX/OR ONLY) (NEEDLE) ×1 IMPLANT
NDL HYPO 25GX1X1/2 BEV (NEEDLE) ×1 IMPLANT
NEEDLE 18GX1X1/2 (RX/OR ONLY) (NEEDLE) ×3 IMPLANT
NEEDLE HYPO 25GX1X1/2 BEV (NEEDLE) ×3 IMPLANT
NS IRRIG 1000ML POUR BTL (IV SOLUTION) ×3 IMPLANT
PACK CV ACCESS (CUSTOM PROCEDURE TRAY) ×3 IMPLANT
PACK SURGICAL SETUP 50X90 (CUSTOM PROCEDURE TRAY) ×3 IMPLANT
PAD ARMBOARD 7.5X6 YLW CONV (MISCELLANEOUS) ×6 IMPLANT
SET COLLECT BLD 21X3/4 12 (NEEDLE) IMPLANT
SPONGE GAUZE 2X2 STER 10/PKG (GAUZE/BANDAGES/DRESSINGS) ×1
SPONGE SURGIFOAM ABS GEL 100 (HEMOSTASIS) IMPLANT
STOPCOCK 4 WAY LG BORE MALE ST (IV SETS) IMPLANT
SUT ETHILON 3 0 PS 1 (SUTURE) ×3 IMPLANT
SUT PROLENE 6 0 CC (SUTURE) ×3 IMPLANT
SUT VIC AB 3-0 SH 27 (SUTURE) ×3
SUT VIC AB 3-0 SH 27X BRD (SUTURE) ×2 IMPLANT
SUT VICRYL 4-0 PS2 18IN ABS (SUTURE) ×3 IMPLANT
SYR 20CC LL (SYRINGE) ×6 IMPLANT
SYR 30ML LL (SYRINGE) IMPLANT
SYR 5ML LL (SYRINGE) ×6 IMPLANT
SYR CONTROL 10ML LL (SYRINGE) ×3 IMPLANT
SYRINGE 10CC LL (SYRINGE) ×3 IMPLANT
TAPE CLOTH SURG 4X10 WHT LF (GAUZE/BANDAGES/DRESSINGS) ×2 IMPLANT
TOWEL OR 17X24 6PK STRL BLUE (TOWEL DISPOSABLE) ×3 IMPLANT
TOWEL OR 17X26 10 PK STRL BLUE (TOWEL DISPOSABLE) ×3 IMPLANT
TUBING EXTENTION W/L.L. (IV SETS) IMPLANT
UNDERPAD 30X30 INCONTINENT (UNDERPADS AND DIAPERS) ×3 IMPLANT
WATER STERILE IRR 1000ML POUR (IV SOLUTION) ×3 IMPLANT
WIRE AMPLATZ SS-J .035X180CM (WIRE) ×2 IMPLANT

## 2012-03-22 NOTE — Op Note (Signed)
Procedure: Ultrasound-guided insertion of Diatek catheter, left internal jugular vein  Preoperative diagnosis: End-stage renal disease  Postoperative diagnosis: Same  Anesthesia: General  Operative findings: 23 cm Diatek catheter left internal jugular vein  Operative details: After obtaining informed consent, the patient was taken to the operating room. The patient was placed in supine position on the operating room table. After adequate sedation the patient's entire neck and chest were prepped and draped in usual sterile fashion. The patient was placed in Trendelenburg position. Ultrasound was used to identify the patient's left internal jugular vein.  The right IJ was not visualized and presumably occluded.   The left IJ had normal compressibility and respiratory variation.Using ultrasound guidance, the left internal jugular vein was successfully cannulated.  A 0.035 J-tipped guidewire was threaded into the left internal jugular vein and into the superior vena cava followed by the inferior vena cava under fluoroscopic guidance.   An H1 catheter was placed over this and the wire exchanged for an 035 Amplatz wire to straighten the innominate artery.   Next sequential 12 and 14 dilators were placed over the guidewire into the right atrium.  A 16 French dilator with a peel-away sheath was then placed over the guidewire into the right atrium.   The guidewire and dilator were removed. A 23 cm Diatek catheter was then placed through the peel away sheath into the right atrium.  The catheter was then tunneled subcutaneously, cut to length, and the hub attached. The catheter was noted to flush and draw easily. The catheter was inspected under fluoroscopy and found with its tip to be in the right atrium without any kinks throughout its course. The catheter was sutured to the skin with nylon sutures. The neck insertion site was closed with Vicryl stitch. The catheter was then loaded with concentrated Heparin  solution. A dry sterile dressing was applied.  The patient tolerated procedure well and there were no complications. Instrument sponge and needle counts correct in the case. The patient was taken to the recovery room in stable condition. Chest x-ray will be obtained in the recovery room.  Fabienne Bruns, MD [facility] Office: [phone] Pager: [phone]

## 2012-03-22 NOTE — Anesthesia Postprocedure Evaluation (Signed)
  Anesthesia Post-op Note  Patient: Jeffrey Manning  Procedure(s) Performed: Procedure(s) (LRB) with comments: INSERTION OF DIALYSIS CATHETER (Left) - Inserted Dialysis catheter Left Internal Jugular  Patient Location: PACU  Anesthesia Type:General  Level of Consciousness: awake, alert , oriented and patient cooperative  Airway and Oxygen Therapy: Patient Spontanous Breathing  Post-op Pain: none  Post-op Assessment: Post-op Vital signs reviewed, Patient's Cardiovascular Status Stable, Respiratory Function Stable, Patent Airway, No signs of Nausea or vomiting and Pain level controlled  Post-op Vital Signs: Reviewed and stable  Complications: No apparent anesthesia complications

## 2012-03-22 NOTE — Progress Notes (Signed)
1300 NOTIFIED DR. Noreene Larsson OF GLUCOSE 66, PATIENT ASYMPTOMATIC.  ORDERS RECEIVED, PATIENT GIVEN GLUCOSE GEL .  CBG AT 1322  WAS 73.

## 2012-03-22 NOTE — Transfer of Care (Signed)
Immediate Anesthesia Transfer of Care Note  Patient: Jeffrey Manning  Procedure(s) Performed: Procedure(s) (LRB) with comments: INSERTION OF DIALYSIS CATHETER (Left) - Inserted Dialysis catheter Left Internal Jugular  Patient Location: PACU  Anesthesia Type:General  Level of Consciousness: awake, alert , oriented and patient cooperative  Airway & Oxygen Therapy: Patient Spontanous Breathing and Patient connected to nasal cannula oxygen  Post-op Assessment: Report given to PACU RN, Post -op Vital signs reviewed and stable and Patient moving all extremities  Post vital signs: Reviewed and stable  Complications: No apparent anesthesia complications

## 2012-03-22 NOTE — Anesthesia Preprocedure Evaluation (Addendum)
Anesthesia Evaluation  Patient identified by MRN, date of birth, ID band Patient awake    Reviewed: Allergy & Precautions, H&P , NPO status , Patient's Chart, lab work & pertinent test results, reviewed documented beta blocker date and time   History of Anesthesia Complications Negative for: history of anesthetic complications  Airway Mallampati: III TM Distance: >3 FB Neck ROM: Full    Dental  (+) Chipped, Teeth Intact and Dental Advisory Given   Pulmonary sleep apnea ,  breath sounds clear to auscultation        Cardiovascular hypertension, Pt. on medications and Pt. on home beta blockers Rhythm:Regular Rate:Normal     Neuro/Psych "extremely claustrophobic" negative neurological ROS     GI/Hepatic negative GI ROS,   Endo/Other  diabetes, Well Controlled, Type 2, Oral Hypoglycemic Agents  Renal/GU ESRF and DialysisRenal diseaseHD last on Monday- unable to get HD today. K-4.8     Musculoskeletal  (+) Arthritis -, Osteoarthritis,    Abdominal   Peds  Hematology negative hematology ROS (+)   Anesthesia Other Findings   Reproductive/Obstetrics                         Anesthesia Physical Anesthesia Plan  ASA: III  Anesthesia Plan: General   Post-op Pain Management:    Induction: Intravenous  Airway Management Planned: Oral ETT  Additional Equipment:   Intra-op Plan:   Post-operative Plan:   Informed Consent:   Dental advisory given  Plan Discussed with: CRNA and Surgeon  Anesthesia Plan Comments: (ESRD last HD 12/30 K-4.8 Type 2 DM glucose 66 given sublingual dextrose htn Obesity Sleep apnea  Plan GA )        Anesthesia Quick Evaluation

## 2012-03-22 NOTE — Preoperative (Signed)
Beta Blockers   Reason not to administer Beta Blockers:Not Applicable, took metoprolol this am 

## 2012-03-22 NOTE — Progress Notes (Signed)
Pt complaint of pain when assisted up to wheelchair from stretcher . Anesthesia called orders received for P O pain medication.

## 2012-03-22 NOTE — H&P (Signed)
  [facility] SHORT STAY H&P  CC:  Clotted left arm fistula  HPI: Clotted left arm fistula.  Recent thrombolysis with large central segment of degenerated vein  Past Medical History  Diagnosis Date  . Hypertension   . Diabetes mellitus   . Dialysis patient   . Back pain   . Sleep apnea     diagnosed approx 15 years,  has not used cpap machine in 4 years  . PONV (postoperative nausea and vomiting)   . Angina     approx. 10 years ago  . Dysrhythmia     DR. [facility]  . Renal failure     dialysis T, Th, Sa.  [address], Dr. Joline Maxcy  . Hiatal hernia   . Arthritis     BACK PAIN    FH:  Non-Contributory  Social HX History  Substance Use Topics  . Smoking status: Former Smoker -- 20 years    Types: Cigarettes  . Smokeless tobacco: Not on file     Comment: stopped apprx 25 years ago  . Alcohol Use: No    Allergies Allergies  Allergen Reactions  . Iohexol Nausea And Vomiting    Medications Current Facility-Administered Medications  Medication Dose Route Frequency Provider Last Rate Last Dose  . 0.9 %  sodium chloride infusion   Intravenous Continuous Larina Earthly, MD 20 mL/hr at 03/22/12 1358    . 0.9 %  sodium chloride infusion   Intravenous Continuous Kipp Brood, MD      . 0.9 % irrigation (POUR BTL)    PRN Sherren Kerns, MD   1,000 mL at 03/22/12 1406  . cefUROXime (ZINACEF) 1.5 g in dextrose 5 % 50 mL IVPB  1.5 g Intravenous 30 min Pre-Op Larina Earthly, MD      . dextrose (GLUTOSE) 40 % oral gel 37.5 g  1 Tube Oral Once Kipp Brood, MD      . heparin 6,000 Units in sodium chloride irrigation 0.9 % 500 mL irrigation    PRN Sherren Kerns, MD         PHYSICAL EXAM  Filed Vitals:   03/22/12 1202  BP: 134/79  Pulse: 89  Temp: 98.6 F (37 C)  Resp: 20    General:  WDWN in NAD HENT: WNL Eyes: Pupils equal Pulmonary: normal non-labored breathing , without Rales, rhonchi,  wheezing Cardiac: RRR, Vascular Exam/Pulses:  clotted left upper arm fistula  Extremities without ischemic changes, no Gangrene , no cellulitis; no open wounds;  Neuro A&O x 3; good sensation; motion in all extremities  Impression: Fistula non salvageable.  Diatek today  Plan: Diatek today.  Office visit for vein map and new access.   Keamber Macfadden,Agostino E @TODAY @ 2:11 PM

## 2012-03-22 NOTE — Progress Notes (Signed)
Pt given Oxycodone immediate release 5 mg per Dr. Gelene Mink anethesia, transported via wheelchair to short stay with tech. Marquis Lunch N called in short stay and updated patient's condition and information regarding medication given before leaving for short stay.

## 2012-03-22 NOTE — Progress Notes (Signed)
Chest xray results dilaysis catheter at Mid SVC ,CAVo atrial junction no pneumothorax per Dr. Reche Dixon radiology

## 2012-03-23 ENCOUNTER — Telehealth: Payer: Self-pay | Admitting: Vascular Surgery

## 2012-03-23 NOTE — Telephone Encounter (Signed)
Spoke with pt to schedule, did not want anything in the immediate future. Scheduled for 04/19/12 @ 1230 and sent letter, dpm

## 2012-03-23 NOTE — Telephone Encounter (Signed)
Message copied by Fredrich Birks on Fri Mar 23, 2012  9:17 AM ------      Message from: Melene Plan      Created: Thu Mar 22, 2012  4:05 PM                   ----- Message -----         From: Sherren Kerns, MD         Sent: 03/22/2012   3:36 PM           To: Reuel Derby, Melene Plan, RN            US neck, left IJ diatek      Needs a vein map of the right arm and outpt office visit to consider new access.            Leonette Most

## 2012-03-26 ENCOUNTER — Encounter (HOSPITAL_COMMUNITY): Payer: Self-pay | Admitting: Vascular Surgery

## 2012-03-30 ENCOUNTER — Other Ambulatory Visit: Payer: Self-pay | Admitting: *Deleted

## 2012-03-30 DIAGNOSIS — N186 End stage renal disease: Secondary | ICD-10-CM

## 2012-03-30 DIAGNOSIS — Z01810 Encounter for preprocedural cardiovascular examination: Secondary | ICD-10-CM

## 2012-04-18 ENCOUNTER — Encounter: Payer: Self-pay | Admitting: Vascular Surgery

## 2012-04-19 ENCOUNTER — Encounter: Payer: Self-pay | Admitting: Vascular Surgery

## 2012-04-19 ENCOUNTER — Encounter (INDEPENDENT_AMBULATORY_CARE_PROVIDER_SITE_OTHER): Payer: Medicare Other | Admitting: *Deleted

## 2012-04-19 ENCOUNTER — Ambulatory Visit (INDEPENDENT_AMBULATORY_CARE_PROVIDER_SITE_OTHER): Payer: Medicare Other | Admitting: Vascular Surgery

## 2012-04-19 VITALS — BP 98/59 | HR 78 | Resp 20 | Ht 72.0 in | Wt 278.0 lb

## 2012-04-19 DIAGNOSIS — N186 End stage renal disease: Secondary | ICD-10-CM

## 2012-04-19 DIAGNOSIS — T82898A Other specified complication of vascular prosthetic devices, implants and grafts, initial encounter: Secondary | ICD-10-CM

## 2012-04-19 DIAGNOSIS — Z01810 Encounter for preprocedural cardiovascular examination: Secondary | ICD-10-CM

## 2012-04-19 DIAGNOSIS — N19 Unspecified kidney failure: Secondary | ICD-10-CM

## 2012-04-19 NOTE — Progress Notes (Signed)
[facility] [place] HISTORY AND PHYSICAL   History of Present Illness:  Patient is a 67 y.o. year old male who presents for placement of a permanent hemodialysis access. The patient is right handed.  The patient is currently on hemodialysis. She dialyzes at [facility] Tuesday Thursday and Saturday. He had a previous left brachiocephalic AV fistula which lasted approximate 6 years. This has now failed. Other chronic medical problems include diabetes, hypertension sleep apnea all currently stable.  Past Medical History  Diagnosis Date  . Hypertension   . Diabetes mellitus   . Dialysis patient   . Back pain   . Sleep apnea     diagnosed approx 15 years,  has not used cpap machine in 4 years  . PONV (postoperative nausea and vomiting)   . Angina     approx. 10 years ago  . Dysrhythmia     [facility]  . Renal failure     dialysis T, Th, Sa.  [address], Dr. mattely  . Hiatal hernia   . Arthritis     BACK PAIN    Past Surgical History  Procedure Date  . Av fistula placement   . Eye surgery     laser surgery 2 year ago bilaterally cataracts  . Back surgery     2012  . Insertion of dialysis catheter 03/22/2012    Procedure: INSERTION OF DIALYSIS CATHETER;  Surgeon: Stafford E Wissam Resor, MD;  Location: [facility] OR;  Service: Vascular;  Laterality: Left;  Inserted Dialysis catheter Left Internal Jugular     Social History History  Substance Use Topics  . Smoking status: Former Smoker -- 20 years    Types: Cigarettes  . Smokeless tobacco: Not on file     Comment: stopped apprx 25 years ago  . Alcohol Use: No    Family History No family history on file.  Allergies  Allergies  Allergen Reactions  . Iohexol Nausea And Vomiting     Current Outpatient Prescriptions  Medication Sig Dispense Refill  . allopurinol (ZYLOPRIM) 100 MG tablet Take 100 mg by mouth daily.      . atorvastatin (LIPITOR) 40 MG tablet Take 40 mg by mouth daily.      .  calcium acetate (PHOSLO) 667 MG capsule Take 1,334 mg by mouth 3 (three) times daily with meals.      . cinacalcet (SENSIPAR) 30 MG tablet Take 30 mg by mouth daily.        . folic acid-vitamin b complex-vitamin c-selenium-zinc (DIALYVITE) 3 MG TABS Take 1 tablet by mouth daily.        . glipiZIDE (GLUCOTROL XL) 5 MG 24 hr tablet Take 5 mg by mouth daily.        . lansoprazole (PREVACID) 30 MG capsule Take 30 mg by mouth daily.        . metoprolol tartrate (LOPRESSOR) 25 MG tablet Take 25 mg by mouth daily.        . oxyCODONE (ROXICODONE) 5 MG immediate release tablet Take 1 tablet (5 mg total) by mouth every 4 (four) hours as needed for pain.  30 tablet  0  . oxyCODONE-acetaminophen (PERCOCET) 5-325 MG per tablet Take 1 tablet by mouth every 6 (six) hours as needed. For pain        ROS:   General:  No weight loss, Fever, chills  HEENT: No recent headaches, no nasal bleeding, no visual changes, no sore throat  Neurologic: No dizziness, blackouts, seizures.   No recent symptoms of stroke or mini- stroke. No recent episodes of slurred speech, or temporary blindness.  Cardiac: No recent episodes of chest pain/pressure, no shortness of breath at rest.  No shortness of breath with exertion.  Denies history of atrial fibrillation or irregular heartbeat  Vascular: No history of rest pain in feet.  No history of claudication.  No history of non-healing ulcer, No history of DVT   Pulmonary: No home oxygen, no productive cough, no hemoptysis,  No asthma or wheezing  Musculoskeletal:  [x ] Arthritis, [x ] Low back pain,  [ ] Joint pain  Hematologic:No history of hypercoagulable state.  No history of easy bleeding.  No history of anemia  Gastrointestinal: No hematochezia or melena,  No gastroesophageal reflux, no trouble swallowing  Urinary: [ x] chronic Kidney disease, [ ] on HD - [ ] MWF or [x ] TTHS, [ ] Burning with urination, [ ] Frequent urination, [ ] Difficulty urinating;   Skin: No  rashes  Psychological: No history of anxiety,  No history of depression   Physical Examination  Filed Vitals:   04/19/12 1300  BP: 98/59  Pulse: 78  Resp: 20  Height: 6' (1.829 m)  Weight: 278 lb (126.1 kg)  SpO2: 97%    Body mass index is 37.70 kg/(m^2).  General:  Alert and oriented, no acute distress HEENT: Normal Neck: No bruit or JVD, left side dialysis catheter Pulmonary: Clear to auscultation bilaterally Cardiac: Regular Rate and Rhythm without murmur Gastrointestinal: Soft, non-tender, non-distended, no mass, obese Skin: No rash Extremity Pulses:  1+ radial, 2  + brachial pulses bilaterally, occluded left upper arm AV fistula Musculoskeletal: No deformity or edema  Neurologic: Upper and lower extremity motor 5/5 and symmetric  DATA: The patient had a vein mapping ultrasound today which I reviewed and interpreted. This shows the cephalic vein in the right upper arm is 3-4 mm. It is small in the forearm. The basilic vein is also 3-4 mm in the upper arm on the right side.   ASSESSMENT:   Needs long-term hemodialysis access.   PLAN:  Right brachiocephalic AV fistula May 02 2012. Risks benefits possible complications and procedure details were explained to the patient and his wife today. He understands and agrees to proceed.  Kaizen Akili Corsetti, MD [facility] [place] Office: [phone] Pager: [phone]  

## 2012-04-25 ENCOUNTER — Other Ambulatory Visit: Payer: Self-pay

## 2012-04-25 ENCOUNTER — Encounter (HOSPITAL_COMMUNITY): Payer: Self-pay | Admitting: Pharmacy Technician

## 2012-05-01 ENCOUNTER — Encounter (HOSPITAL_COMMUNITY): Payer: Self-pay | Admitting: *Deleted

## 2012-05-01 MED ORDER — DEXTROSE 5 % IV SOLN
1.5000 g | INTRAVENOUS | Status: AC
  Administered 2012-05-02: 1.5 g via INTRAVENOUS
  Filled 2012-05-01: qty 1.5

## 2012-05-02 ENCOUNTER — Ambulatory Visit (HOSPITAL_COMMUNITY): Payer: Medicare Other | Admitting: Certified Registered"

## 2012-05-02 ENCOUNTER — Telehealth: Payer: Self-pay | Admitting: Vascular Surgery

## 2012-05-02 ENCOUNTER — Ambulatory Visit (HOSPITAL_COMMUNITY)
Admission: RE | Admit: 2012-05-02 | Discharge: 2012-05-02 | Disposition: A | Discharge status: Home / Self Care | Payer: Medicare Other | Source: Ambulatory Visit | Attending: Vascular Surgery | Admitting: Vascular Surgery

## 2012-05-02 ENCOUNTER — Encounter (HOSPITAL_COMMUNITY): Payer: Self-pay

## 2012-05-02 ENCOUNTER — Encounter (HOSPITAL_COMMUNITY)
Admission: RE | Disposition: A | Discharge status: Home / Self Care | Payer: Self-pay | Source: Ambulatory Visit | Attending: Vascular Surgery

## 2012-05-02 ENCOUNTER — Other Ambulatory Visit: Payer: Self-pay | Admitting: *Deleted

## 2012-05-02 ENCOUNTER — Encounter (HOSPITAL_COMMUNITY): Payer: Self-pay | Admitting: Certified Registered"

## 2012-05-02 DIAGNOSIS — N186 End stage renal disease: Secondary | ICD-10-CM

## 2012-05-02 DIAGNOSIS — E119 Type 2 diabetes mellitus without complications: Secondary | ICD-10-CM | POA: Insufficient documentation

## 2012-05-02 DIAGNOSIS — N189 Chronic kidney disease, unspecified: Secondary | ICD-10-CM

## 2012-05-02 DIAGNOSIS — Z992 Dependence on renal dialysis: Secondary | ICD-10-CM | POA: Insufficient documentation

## 2012-05-02 DIAGNOSIS — Z4931 Encounter for adequacy testing for hemodialysis: Secondary | ICD-10-CM

## 2012-05-02 DIAGNOSIS — I120 Hypertensive chronic kidney disease with stage 5 chronic kidney disease or end stage renal disease: Secondary | ICD-10-CM | POA: Insufficient documentation

## 2012-05-02 HISTORY — DX: Acute myocardial infarction, unspecified: I21.9

## 2012-05-02 HISTORY — PX: AV FISTULA PLACEMENT: SHX1204

## 2012-05-02 LAB — SURGICAL PCR SCREEN
MRSA, PCR: NEGATIVE
Staphylococcus aureus: NEGATIVE

## 2012-05-02 LAB — POCT I-STAT 4, (NA,K, GLUC, HGB,HCT): Hemoglobin: 13.9 g/dL (ref 13.0–17.0)

## 2012-05-02 SURGERY — ARTERIOVENOUS (AV) FISTULA CREATION
Anesthesia: General | Site: Arm Upper | Laterality: Right | Wound class: Clean

## 2012-05-02 MED ORDER — HEPARIN SODIUM (PORCINE) 1000 UNIT/ML IJ SOLN
INTRAMUSCULAR | Status: DC | PRN
  Administered 2012-05-02: 5000 [IU] via INTRAVENOUS

## 2012-05-02 MED ORDER — PROPOFOL 10 MG/ML IV BOLUS
INTRAVENOUS | Status: DC | PRN
  Administered 2012-05-02: 200 mg via INTRAVENOUS

## 2012-05-02 MED ORDER — DEXTROSE 50 % IV SOLN
INTRAVENOUS | Status: DC | PRN
  Administered 2012-05-02: 12.5 g via INTRAVENOUS

## 2012-05-02 MED ORDER — MUPIROCIN 2 % EX OINT: TOPICAL_OINTMENT | Freq: Two times a day (BID) | CUTANEOUS | Status: DC

## 2012-05-02 MED ORDER — 0.9 % SODIUM CHLORIDE (POUR BTL) OPTIME
TOPICAL | Status: DC | PRN
  Administered 2012-05-02: 1000 mL

## 2012-05-02 MED ORDER — HYDROMORPHONE HCL PF 1 MG/ML IJ SOLN: 0.2500 mg | INTRAMUSCULAR | Status: DC | PRN

## 2012-05-02 MED ORDER — LIDOCAINE HCL (PF) 1 % IJ SOLN
INTRAMUSCULAR | Status: AC
  Filled 2012-05-02: qty 30

## 2012-05-02 MED ORDER — LIDOCAINE HCL (CARDIAC) 20 MG/ML IV SOLN
INTRAVENOUS | Status: DC | PRN
  Administered 2012-05-02: 60 mg via INTRAVENOUS

## 2012-05-02 MED ORDER — SODIUM CHLORIDE 0.9 % IV SOLN
10.0000 mg | INTRAVENOUS | Status: DC | PRN
  Administered 2012-05-02: 10 ug/min via INTRAVENOUS

## 2012-05-02 MED ORDER — PHENYLEPHRINE HCL 10 MG/ML IJ SOLN
INTRAMUSCULAR | Status: DC | PRN
  Administered 2012-05-02: 80 ug via INTRAVENOUS
  Administered 2012-05-02: 160 ug via INTRAVENOUS
  Administered 2012-05-02: 120 ug via INTRAVENOUS
  Administered 2012-05-02 (×2): 80 ug via INTRAVENOUS
  Administered 2012-05-02: 120 ug via INTRAVENOUS

## 2012-05-02 MED ORDER — FENTANYL CITRATE 0.05 MG/ML IJ SOLN
INTRAMUSCULAR | Status: DC | PRN
  Administered 2012-05-02: 100 ug via INTRAVENOUS
  Administered 2012-05-02 (×4): 25 ug via INTRAVENOUS

## 2012-05-02 MED ORDER — MUPIROCIN 2 % EX OINT
TOPICAL_OINTMENT | CUTANEOUS | Status: AC
  Administered 2012-05-02: 1 via NASAL
  Filled 2012-05-02: qty 22

## 2012-05-02 MED ORDER — SODIUM CHLORIDE 0.9 % IR SOLN
Status: DC | PRN
  Administered 2012-05-02: 08:00:00

## 2012-05-02 MED ORDER — LIDOCAINE HCL (PF) 1 % IJ SOLN: INTRAMUSCULAR | Status: DC | PRN

## 2012-05-02 MED ORDER — THROMBIN 20000 UNITS EX SOLR
CUTANEOUS | Status: AC
  Filled 2012-05-02: qty 20000

## 2012-05-02 MED ORDER — ONDANSETRON HCL 4 MG/2ML IJ SOLN
INTRAMUSCULAR | Status: DC | PRN
  Administered 2012-05-02: 4 mg via INTRAVENOUS

## 2012-05-02 MED ORDER — SODIUM CHLORIDE 0.9 % IV SOLN
INTRAVENOUS | Status: DC | PRN
  Administered 2012-05-02: 08:00:00 via INTRAVENOUS

## 2012-05-02 SURGICAL SUPPLY — 38 items
ADH SKN CLS APL DERMABOND .7 (GAUZE/BANDAGES/DRESSINGS) ×1
CANISTER SUCTION 2500CC (MISCELLANEOUS) ×2 IMPLANT
CLIP TI MEDIUM 6 (CLIP) ×2 IMPLANT
CLIP TI WIDE RED SMALL 6 (CLIP) ×2 IMPLANT
CLOTH BEACON ORANGE TIMEOUT ST (SAFETY) ×2 IMPLANT
COVER PROBE W GEL 5X96 (DRAPES) ×2 IMPLANT
COVER SURGICAL LIGHT HANDLE (MISCELLANEOUS) ×2 IMPLANT
DECANTER SPIKE VIAL GLASS SM (MISCELLANEOUS) ×2 IMPLANT
DERMABOND ADVANCED (GAUZE/BANDAGES/DRESSINGS) ×1
DERMABOND ADVANCED .7 DNX12 (GAUZE/BANDAGES/DRESSINGS) ×1 IMPLANT
DRAIN PENROSE 1/4X12 LTX STRL (WOUND CARE) ×2 IMPLANT
ELECT REM PT RETURN 9FT ADLT (ELECTROSURGICAL) ×2
ELECTRODE REM PT RTRN 9FT ADLT (ELECTROSURGICAL) ×1 IMPLANT
GEL ULTRASOUND 20GR AQUASONIC (MISCELLANEOUS) IMPLANT
GLOVE BIO SURGEON STRL SZ7.5 (GLOVE) ×2 IMPLANT
GLOVE BIOGEL PI IND STRL 6.5 (GLOVE) IMPLANT
GLOVE BIOGEL PI IND STRL 7.0 (GLOVE) IMPLANT
GLOVE BIOGEL PI INDICATOR 6.5 (GLOVE) ×1
GLOVE BIOGEL PI INDICATOR 7.0 (GLOVE) ×1
GLOVE ECLIPSE 6.5 STRL STRAW (GLOVE) ×1 IMPLANT
GLOVE SURG SS PI 7.5 STRL IVOR (GLOVE) ×1 IMPLANT
GOWN PREVENTION PLUS XLARGE (GOWN DISPOSABLE) ×2 IMPLANT
GOWN STRL NON-REIN LRG LVL3 (GOWN DISPOSABLE) ×4 IMPLANT
KIT BASIN OR (CUSTOM PROCEDURE TRAY) ×2 IMPLANT
KIT ROOM TURNOVER OR (KITS) ×2 IMPLANT
LOOP VESSEL MINI RED (MISCELLANEOUS) IMPLANT
NS IRRIG 1000ML POUR BTL (IV SOLUTION) ×2 IMPLANT
PACK CV ACCESS (CUSTOM PROCEDURE TRAY) ×2 IMPLANT
PAD ARMBOARD 7.5X6 YLW CONV (MISCELLANEOUS) ×4 IMPLANT
SPONGE SURGIFOAM ABS GEL 100 (HEMOSTASIS) IMPLANT
SUT PROLENE 7 0 BV 1 (SUTURE) ×2 IMPLANT
SUT VIC AB 3-0 SH 27 (SUTURE) ×2
SUT VIC AB 3-0 SH 27X BRD (SUTURE) ×1 IMPLANT
SUT VICRYL 4-0 PS2 18IN ABS (SUTURE) ×2 IMPLANT
TOWEL OR 17X24 6PK STRL BLUE (TOWEL DISPOSABLE) ×2 IMPLANT
TOWEL OR 17X26 10 PK STRL BLUE (TOWEL DISPOSABLE) ×2 IMPLANT
UNDERPAD 30X30 INCONTINENT (UNDERPADS AND DIAPERS) ×2 IMPLANT
WATER STERILE IRR 1000ML POUR (IV SOLUTION) ×2 IMPLANT

## 2012-05-02 NOTE — Anesthesia Procedure Notes (Signed)
Procedure Name: LMA Insertion Date/Time: 05/02/2012 8:37 AM Performed by: Jerilee Hoh Pre-anesthesia Checklist: Patient identified, Emergency Drugs available, Suction available and Patient being monitored Patient Re-evaluated:Patient Re-evaluated prior to inductionOxygen Delivery Method: Circle system utilized Preoxygenation: Pre-oxygenation with 100% oxygen Intubation Type: IV induction Ventilation: Mask ventilation without difficulty LMA: LMA inserted LMA Size: 4.0 Tube type: Oral Number of attempts: 1 Placement Confirmation: positive ETCO2 and breath sounds checked- equal and bilateral Tube secured with: Tape Dental Injury: Teeth and Oropharynx as per pre-operative assessment

## 2012-05-02 NOTE — Preoperative (Signed)
Beta Blockers   Reason not to administer Beta Blockers:Not Applicable 

## 2012-05-02 NOTE — Interval H&P Note (Signed)
History and Physical Interval Note:  05/02/2012 8:06 AM  Jeffrey Manning  has presented today for surgery, with the diagnosis of End Stage Renal Disease  The various methods of treatment have been discussed with the patient and family. After consideration of risks, benefits and other options for treatment, the patient has consented to  Procedure(s): ARTERIOVENOUS (AV) FISTULA CREATION (Right) as a surgical intervention .  The patient's history has been reviewed, patient examined, no change in status, stable for surgery.  I have reviewed the patient's chart and labs.  Questions were answered to the patient's satisfaction.     [name],[place] E

## 2012-05-02 NOTE — Anesthesia Preprocedure Evaluation (Signed)
Anesthesia Evaluation  Patient identified by MRN, date of birth, ID band Patient awake    Reviewed: Allergy & Precautions, H&P , NPO status , Patient's Chart, lab work & pertinent test results  Airway Mallampati: II      Dental   Pulmonary sleep apnea ,          Cardiovascular hypertension, + angina + Past MI     Neuro/Psych    GI/Hepatic Neg liver ROS, hiatal hernia,   Endo/Other  diabetes  Renal/GU Renal disease     Musculoskeletal   Abdominal   Peds  Hematology   Anesthesia Other Findings   Reproductive/Obstetrics                           Anesthesia Physical Anesthesia Plan  ASA: III  Anesthesia Plan: General   Post-op Pain Management:    Induction: Intravenous  Airway Management Planned: LMA  Additional Equipment:   Intra-op Plan:   Post-operative Plan: Extubation in OR  Informed Consent:   Dental advisory given  Plan Discussed with: CRNA, Anesthesiologist and Surgeon  Anesthesia Plan Comments:         Anesthesia Quick Evaluation

## 2012-05-02 NOTE — Telephone Encounter (Signed)
Message copied by Margaretmary Eddy on Wed May 02, 2012 11:08 AM ------      Message from: Fabienne Bruns E      Created: Wed May 02, 2012  9:49 AM       Right brachial cephalic AVF      Please schedule follow up appt in 1 month      Asst nurse            Leonette Most ------

## 2012-05-02 NOTE — Anesthesia Postprocedure Evaluation (Signed)
  Anesthesia Post-op Note  Patient: Jeffrey Manning  Procedure(s) Performed: Procedure(s): ARTERIOVENOUS (AV) FISTULA CREATION (Right)  Patient Location: PACU  Anesthesia Type:General  Level of Consciousness: awake  Airway and Oxygen Therapy: Patient Spontanous Breathing  Post-op Pain: mild  Post-op Assessment: Post-op Vital signs reviewed  Post-op Vital Signs: Reviewed  Complications: No apparent anesthesia complications

## 2012-05-02 NOTE — Transfer of Care (Signed)
Immediate Anesthesia Transfer of Care Note  Patient: Jeffrey Manning  Procedure(s) Performed: Procedure(s): ARTERIOVENOUS (AV) FISTULA CREATION (Right)  Patient Location: PACU  Anesthesia Type:General  Level of Consciousness: awake, alert , oriented and patient cooperative  Airway & Oxygen Therapy: Patient Spontanous Breathing and Patient connected to face mask oxygen  Post-op Assessment: Report given to PACU RN, Post -op Vital signs reviewed and stable and Patient moving all extremities  Post vital signs: Reviewed and stable  Complications: No apparent anesthesia complications

## 2012-05-02 NOTE — H&P (View-Only) (Signed)
[facility] [place] HISTORY AND PHYSICAL   History of Present Illness:  Patient is a 67 y.o. year old male who presents for placement of a permanent hemodialysis access. The patient is right handed.  The patient is currently on hemodialysis. She [facility]zes at [facility] Thursday and Saturday. He had a previous left brachiocephalic AV fistula which lasted approximate 6 years. This has now failed. Other chronic medical problems include diabetes, hypertension sleep apnea all currently stable.  Past Medical History  Diagnosis Date  . Hypertension   . Diabetes mellitus   . Dialysis patient   . Back pain   . Sleep apnea     diagnosed approx 15 years,  has not used cpap machine in 4 years  . PONV (postoperative nausea and vomiting)   . Angina     approx. 10 years ago  . Dysrhythmia     [facility]  . Renal failure     dialysis T, Th, Sa.  [address], Dr. Joline Maxcy  . Hiatal hernia   . Arthritis     BACK PAIN    Past Surgical History  Procedure Date  . Av fistula placement   . Eye surgery     laser surgery 2 year ago bilaterally cataracts  . Back surgery     2012  . Insertion of dialysis catheter 03/22/2012    Procedure: INSERTION OF DIALYSIS CATHETER;  Surgeon: Sherren Kerns, MD;  Location: [facility] OR;  Service: Vascular;  Laterality: Left;  Inserted Dialysis catheter Left Internal Jugular     Social History History  Substance Use Topics  . Smoking status: Former Smoker -- 20 years    Types: Cigarettes  . Smokeless tobacco: Not on file     Comment: stopped apprx 25 years ago  . Alcohol Use: No    Family History No family history on file.  Allergies  Allergies  Allergen Reactions  . Iohexol Nausea And Vomiting     Current Outpatient Prescriptions  Medication Sig Dispense Refill  . allopurinol (ZYLOPRIM) 100 MG tablet Take 100 mg by mouth daily.      Marland Kitchen atorvastatin (LIPITOR) 40 MG tablet Take 40 mg by mouth daily.      .  calcium acetate (PHOSLO) 667 MG capsule Take 1,334 mg by mouth 3 (three) times daily with meals.      . cinacalcet (SENSIPAR) 30 MG tablet Take 30 mg by mouth daily.        . folic acid-vitamin b complex-vitamin c-selenium-zinc (DIALYVITE) 3 MG TABS Take 1 tablet by mouth daily.        Marland Kitchen glipiZIDE (GLUCOTROL XL) 5 MG 24 hr tablet Take 5 mg by mouth daily.        . lansoprazole (PREVACID) 30 MG capsule Take 30 mg by mouth daily.        . metoprolol tartrate (LOPRESSOR) 25 MG tablet Take 25 mg by mouth daily.        Marland Kitchen oxyCODONE (ROXICODONE) 5 MG immediate release tablet Take 1 tablet (5 mg total) by mouth every 4 (four) hours as needed for pain.  30 tablet  0  . oxyCODONE-acetaminophen (PERCOCET) 5-325 MG per tablet Take 1 tablet by mouth every 6 (six) hours as needed. For pain        ROS:   General:  No weight loss, Fever, chills  HEENT: No recent headaches, no nasal bleeding, no visual changes, no sore throat  Neurologic: No dizziness, blackouts, seizures.  No recent symptoms of stroke or mini- stroke. No recent episodes of slurred speech, or temporary blindness.  Cardiac: No recent episodes of chest pain/pressure, no shortness of breath at rest.  No shortness of breath with exertion.  Denies history of atrial fibrillation or irregular heartbeat  Vascular: No history of rest pain in feet.  No history of claudication.  No history of non-healing ulcer, No history of DVT   Pulmonary: No home oxygen, no productive cough, no hemoptysis,  No asthma or wheezing  Musculoskeletal:  [x ] Arthritis, [x ] Low back pain,  [ ]  Joint pain  Hematologic:No history of hypercoagulable state.  No history of easy bleeding.  No history of anemia  Gastrointestinal: No hematochezia or melena,  No gastroesophageal reflux, no trouble swallowing  Urinary: [ x] chronic Kidney disease, [ ]  on HD - [ ]  MWF or [x ] TTHS, [ ]  Burning with urination, [ ]  Frequent urination, [ ]  Difficulty urinating;   Skin: No  rashes  Psychological: No history of anxiety,  No history of depression   Physical Examination  Filed Vitals:   04/19/12 1300  BP: 98/59  Pulse: 78  Resp: 20  Height: 6' (1.829 m)  Weight: 278 lb (126.1 kg)  SpO2: 97%    Body mass index is 37.70 kg/(m^2).  General:  Alert and oriented, no acute distress HEENT: Normal Neck: No bruit or JVD, left side dialysis catheter Pulmonary: Clear to auscultation bilaterally Cardiac: Regular Rate and Rhythm without murmur Gastrointestinal: Soft, non-tender, non-distended, no mass, obese Skin: No rash Extremity Pulses:  1+ radial, 2  + brachial pulses bilaterally, occluded left upper arm AV fistula Musculoskeletal: No deformity or edema  Neurologic: Upper and lower extremity motor 5/5 and symmetric  DATA: The patient had a vein mapping ultrasound today which I reviewed and interpreted. This shows the cephalic vein in the right upper arm is 3-4 mm. It is small in the forearm. The basilic vein is also 3-4 mm in the upper arm on the right side.   ASSESSMENT:   Needs long-term hemodialysis access.   PLAN:  Right brachiocephalic AV fistula May 02 2012. Risks benefits possible complications and procedure details were explained to the patient and his wife today. He understands and agrees to proceed.  Fabienne Bruns, MD [facility] [place] Office: [phone] Pager: [phone]

## 2012-05-02 NOTE — Op Note (Signed)
Procedure: Right Brachial Cephalic AV fistula  Preop: ESRD  Postop: ESRD  Anesthesia: General  Assistant: Tilden Fossa, RNFA  Findings: 3.5 mm cephalic vein, calcified artery  Procedure: After obtaining informed consent, the patient was taken to the operating room.  After induction of general anesthesia, the right upper extremity was prepped and draped in usual sterile fashion.  A transverse incision was then made near the antecubital crease the right arm. The incision was carried into the subcutaneous tissues down to level of the cephalic vein. The cephalic vein was approximately 3.5 mm in diameter. It was of good quality. This was dissected free circumferentially and small side branches ligated and divided between silk ties or clips. Next the brachial artery was dissected free in the medial portion of the incision. It was very calcified The artery was  3-4 mm in diameter. The vessel loops were placed proximal and distal to the planned site of arteriotomy. The patient was given 5000 units of intravenous heparin. After appropriate circulation time, the vessel loops were used to control the artery. A longitudinal opening was made in the brachial artery.  The vein was ligated distally with a 2-0 silk tie. The vein was controlled proximally with a fine bulldog clamp. The vein was then swung over to the artery and sewn end of vein to side of artery using a running 7-0 Prolene suture. Just prior to completion of the anastomosis, everything was fore bled back bled and thoroughly flushed. The anastomosis was secured, vessel loops released, and there was a palpable thrill in the fistula immediately. After hemostasis was obtained, the subcutaneous tissues were reapproximated using a running 3-0 Vicryl suture. The skin was then closed with a 4 Vicryl subcuticular stitch. Dermabond was applied to the skin incision.  The patient had an audible radial signal at the end of the case.  Fabienne Bruns, MD [facility] [place] Office: [phone] Pager: [phone]

## 2012-05-07 ENCOUNTER — Encounter (HOSPITAL_COMMUNITY): Payer: Self-pay | Admitting: Vascular Surgery

## 2012-06-06 ENCOUNTER — Encounter: Payer: Self-pay | Admitting: Vascular Surgery

## 2012-06-07 ENCOUNTER — Ambulatory Visit: Payer: Medicare Other | Admitting: Vascular Surgery

## 2012-06-07 ENCOUNTER — Encounter: Payer: Self-pay | Admitting: Vascular Surgery

## 2012-06-07 ENCOUNTER — Ambulatory Visit (INDEPENDENT_AMBULATORY_CARE_PROVIDER_SITE_OTHER): Payer: Medicare Other | Admitting: Vascular Surgery

## 2012-06-07 ENCOUNTER — Encounter (INDEPENDENT_AMBULATORY_CARE_PROVIDER_SITE_OTHER): Payer: Medicare Other | Admitting: *Deleted

## 2012-06-07 VITALS — BP 107/38 | HR 95 | Resp 16 | Ht 72.0 in | Wt 280.0 lb

## 2012-06-07 DIAGNOSIS — N189 Chronic kidney disease, unspecified: Secondary | ICD-10-CM

## 2012-06-07 DIAGNOSIS — N186 End stage renal disease: Secondary | ICD-10-CM

## 2012-06-07 DIAGNOSIS — T82598A Other mechanical complication of other cardiac and vascular devices and implants, initial encounter: Secondary | ICD-10-CM

## 2012-06-07 DIAGNOSIS — Z4931 Encounter for adequacy testing for hemodialysis: Secondary | ICD-10-CM

## 2012-06-07 NOTE — Progress Notes (Signed)
Patient is a 67 year old male who returns for followup today. He had a right brachiocephalic fistula created on February 12. He denies any numbness or tingling in the hand. He has no drainage from the incision. He is currently on dialysis via a catheter.    Physical exam:  Filed Vitals:   06/07/12 1255  BP: 107/38  Pulse: 95  Resp: 16  Height: 6' (1.829 m)  Weight: 280 lb (127.007 kg)  SpO2: 80%   Right upper extremity: Palpable thrill audible bruit well-healed antecubital incision right and warm and well-perfused  Data: The patient had a duplex ultrasound AV fistula today which I reviewed and interpreted. This shows the fistula 7-9 mm in diameter. However it is 7 mm or more in depth.  Assessment: Patent right upper arm AV fistula. The patient has not been exercising the fistula.  Plan: The patient will followup in 2 months. He will start exercising the fistula today. He will also have his dialysis center inspected the fistula. It does not look like they will be able to cannulate the fistula we will consider Superficialization if it is not more prominent in 2 months  Fabienne Bruns, MD [facility] [place] Office: [phone] Pager: [phone]

## 2012-07-11 ENCOUNTER — Encounter: Payer: Self-pay | Admitting: Vascular Surgery

## 2012-07-12 ENCOUNTER — Encounter: Payer: Self-pay | Admitting: Vascular Surgery

## 2012-07-12 ENCOUNTER — Ambulatory Visit (INDEPENDENT_AMBULATORY_CARE_PROVIDER_SITE_OTHER): Payer: Medicare Other | Admitting: Vascular Surgery

## 2012-07-12 VITALS — BP 109/48 | HR 79 | Temp 98.2°F | Resp 20 | Ht 72.0 in | Wt 285.0 lb

## 2012-07-12 DIAGNOSIS — T82898A Other specified complication of vascular prosthetic devices, implants and grafts, initial encounter: Secondary | ICD-10-CM

## 2012-07-12 DIAGNOSIS — N186 End stage renal disease: Secondary | ICD-10-CM

## 2012-07-12 NOTE — Progress Notes (Signed)
Patient is a 66-year-old oh returns for followup today. He had a left sided catheter placed and a right brachiocephalic AV fistula placed on February 12. He reports no problems with his catheter. He reports no numbness or tingling in his right hand.  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 Risks benefits possible complications and procedure details discussed with the patient today. He wished to proceed.  Lindley Fields, MD [facility] [place] Office: [phone] Pager: [phone]  

## 2012-07-13 ENCOUNTER — Other Ambulatory Visit: Payer: Self-pay

## 2012-07-19 ENCOUNTER — Encounter (HOSPITAL_COMMUNITY): Payer: Self-pay | Admitting: Pharmacy Technician

## 2012-07-20 ENCOUNTER — Encounter (HOSPITAL_COMMUNITY): Payer: Self-pay | Admitting: *Deleted

## 2012-07-20 NOTE — Progress Notes (Signed)
Pt denies SOB, chest pain, and being under the care of a cardiologist. Pt stated that he had a heart attack in early 2014 but wife denies that it occurred. Pt states that he was seen by a cardiologist over 5 years ago where he had a stress test but cannot recall the name of the doctor or the location of the office. Revonda Standard reviewed the EKG dated for 03/22/12 and advised that a repeat EKG is done the day of surgery.

## 2012-07-22 MED ORDER — DEXTROSE 5 % IV SOLN
1.5000 g | INTRAVENOUS | Status: AC
  Administered 2012-07-23: 1.5 g via INTRAVENOUS
  Filled 2012-07-22: qty 1.5

## 2012-07-23 ENCOUNTER — Telehealth: Payer: Self-pay | Admitting: Vascular Surgery

## 2012-07-23 ENCOUNTER — Encounter (HOSPITAL_COMMUNITY)
Admission: RE | Disposition: A | Discharge status: Home / Self Care | Payer: Self-pay | Source: Ambulatory Visit | Attending: Vascular Surgery

## 2012-07-23 ENCOUNTER — Ambulatory Visit (HOSPITAL_COMMUNITY)
Admission: RE | Admit: 2012-07-23 | Discharge: 2012-07-23 | Disposition: A | Discharge status: Home / Self Care | Payer: Medicare Other | Source: Ambulatory Visit | Attending: Vascular Surgery | Admitting: Vascular Surgery

## 2012-07-23 ENCOUNTER — Encounter (HOSPITAL_COMMUNITY): Payer: Self-pay | Admitting: *Deleted

## 2012-07-23 ENCOUNTER — Other Ambulatory Visit: Payer: Self-pay

## 2012-07-23 ENCOUNTER — Ambulatory Visit (HOSPITAL_COMMUNITY): Payer: Medicare Other | Admitting: Anesthesiology

## 2012-07-23 ENCOUNTER — Encounter (HOSPITAL_COMMUNITY): Payer: Self-pay | Admitting: Anesthesiology

## 2012-07-23 DIAGNOSIS — T82598A Other mechanical complication of other cardiac and vascular devices and implants, initial encounter: Secondary | ICD-10-CM | POA: Insufficient documentation

## 2012-07-23 DIAGNOSIS — Z87891 Personal history of nicotine dependence: Secondary | ICD-10-CM | POA: Insufficient documentation

## 2012-07-23 DIAGNOSIS — I499 Cardiac arrhythmia, unspecified: Secondary | ICD-10-CM | POA: Insufficient documentation

## 2012-07-23 DIAGNOSIS — I252 Old myocardial infarction: Secondary | ICD-10-CM | POA: Insufficient documentation

## 2012-07-23 DIAGNOSIS — Y832 Surgical operation with anastomosis, bypass or graft as the cause of abnormal reaction of the patient, or of later complication, without mention of misadventure at the time of the procedure: Secondary | ICD-10-CM | POA: Insufficient documentation

## 2012-07-23 DIAGNOSIS — E119 Type 2 diabetes mellitus without complications: Secondary | ICD-10-CM | POA: Insufficient documentation

## 2012-07-23 DIAGNOSIS — G4730 Sleep apnea, unspecified: Secondary | ICD-10-CM | POA: Insufficient documentation

## 2012-07-23 DIAGNOSIS — T82898A Other specified complication of vascular prosthetic devices, implants and grafts, initial encounter: Secondary | ICD-10-CM

## 2012-07-23 DIAGNOSIS — I120 Hypertensive chronic kidney disease with stage 5 chronic kidney disease or end stage renal disease: Secondary | ICD-10-CM | POA: Insufficient documentation

## 2012-07-23 DIAGNOSIS — N186 End stage renal disease: Secondary | ICD-10-CM | POA: Insufficient documentation

## 2012-07-23 HISTORY — PX: AV FISTULA PLACEMENT: SHX1204

## 2012-07-23 HISTORY — DX: Pneumonia, unspecified organism: J18.9

## 2012-07-23 LAB — GLUCOSE, CAPILLARY
Glucose-Capillary: 83 mg/dL (ref 70–99)
Glucose-Capillary: 74 mg/dL (ref 70–99)

## 2012-07-23 LAB — POCT I-STAT 4, (NA,K, GLUC, HGB,HCT)
HCT: 47 % (ref 39.0–52.0)
Sodium: 139 mEq/L (ref 135–145)

## 2012-07-23 LAB — SURGICAL PCR SCREEN
MRSA, PCR: NEGATIVE
Staphylococcus aureus: NEGATIVE

## 2012-07-23 SURGERY — FISTULA SUPERFICIALIZATION
Anesthesia: General | Site: Arm Upper | Laterality: Right | Wound class: Clean

## 2012-07-23 MED ORDER — SODIUM CHLORIDE 0.9 % IR SOLN
Status: DC | PRN
  Administered 2012-07-23: 13:00:00

## 2012-07-23 MED ORDER — SODIUM CHLORIDE 0.9 % IV SOLN
INTRAVENOUS | Status: DC
  Administered 2012-07-23: 13:00:00 via INTRAVENOUS

## 2012-07-23 MED ORDER — ROCURONIUM BROMIDE 100 MG/10ML IV SOLN
INTRAVENOUS | Status: DC | PRN
  Administered 2012-07-23: 30 mg via INTRAVENOUS

## 2012-07-23 MED ORDER — MUPIROCIN 2 % EX OINT: TOPICAL_OINTMENT | Freq: Two times a day (BID) | CUTANEOUS | Status: DC

## 2012-07-23 MED ORDER — 0.9 % SODIUM CHLORIDE (POUR BTL) OPTIME
TOPICAL | Status: DC | PRN
  Administered 2012-07-23: 1000 mL

## 2012-07-23 MED ORDER — FENTANYL CITRATE 0.05 MG/ML IJ SOLN: INTRAMUSCULAR | Status: DC | PRN

## 2012-07-23 MED ORDER — MUPIROCIN 2 % EX OINT
TOPICAL_OINTMENT | CUTANEOUS | Status: AC
  Filled 2012-07-23: qty 22

## 2012-07-23 MED ORDER — HYDROCODONE-ACETAMINOPHEN 5-325 MG PO TABS: 1.0000 | ORAL_TABLET | Freq: Four times a day (QID) | ORAL | Status: AC | PRN

## 2012-07-23 MED ORDER — NEOSTIGMINE METHYLSULFATE 1 MG/ML IJ SOLN
INTRAMUSCULAR | Status: DC | PRN
  Administered 2012-07-23: 3 mg via INTRAVENOUS

## 2012-07-23 MED ORDER — HYDROMORPHONE HCL PF 1 MG/ML IJ SOLN
0.2500 mg | INTRAMUSCULAR | Status: DC | PRN
Start: 2012-07-23 — End: 2012-07-23

## 2012-07-23 MED ORDER — ONDANSETRON HCL 4 MG/2ML IJ SOLN: 4.0000 mg | Freq: Once | INTRAMUSCULAR | Status: DC | PRN

## 2012-07-23 MED ORDER — FENTANYL CITRATE 0.05 MG/ML IJ SOLN
INTRAMUSCULAR | Status: DC | PRN
  Administered 2012-07-23 (×2): 100 ug via INTRAVENOUS

## 2012-07-23 MED ORDER — THROMBIN 20000 UNITS EX SOLR
CUTANEOUS | Status: AC
  Filled 2012-07-23: qty 20000

## 2012-07-23 MED ORDER — SODIUM CHLORIDE 0.9 % IV SOLN
INTRAVENOUS | Status: DC
  Administered 2012-07-23 (×2): via INTRAVENOUS

## 2012-07-23 MED ORDER — GLYCOPYRROLATE 0.2 MG/ML IJ SOLN
INTRAMUSCULAR | Status: DC | PRN
  Administered 2012-07-23: 0.4 mg via INTRAVENOUS

## 2012-07-23 MED ORDER — PROPOFOL 10 MG/ML IV BOLUS
INTRAVENOUS | Status: DC | PRN
  Administered 2012-07-23: 200 mg via INTRAVENOUS

## 2012-07-23 MED ORDER — LIDOCAINE HCL (CARDIAC) 20 MG/ML IV SOLN
INTRAVENOUS | Status: DC | PRN
  Administered 2012-07-23: 100 mg via INTRAVENOUS

## 2012-07-23 MED ORDER — ONDANSETRON HCL 4 MG/2ML IJ SOLN
INTRAMUSCULAR | Status: DC | PRN
  Administered 2012-07-23: 4 mg via INTRAVENOUS

## 2012-07-23 MED ORDER — SUCCINYLCHOLINE CHLORIDE 20 MG/ML IJ SOLN
INTRAMUSCULAR | Status: DC | PRN
  Administered 2012-07-23: 100 mg via INTRAVENOUS

## 2012-07-23 SURGICAL SUPPLY — 41 items
ADH SKN CLS APL DERMABOND .7 (GAUZE/BANDAGES/DRESSINGS) ×2
CANISTER SUCTION 2500CC (MISCELLANEOUS) ×2 IMPLANT
CLIP TI MEDIUM 6 (CLIP) ×2 IMPLANT
CLIP TI WIDE RED SMALL 6 (CLIP) ×2 IMPLANT
CLOTH BEACON ORANGE TIMEOUT ST (SAFETY) ×2 IMPLANT
COVER PROBE W GEL 5X96 (DRAPES) ×2 IMPLANT
COVER SURGICAL LIGHT HANDLE (MISCELLANEOUS) ×2 IMPLANT
DECANTER SPIKE VIAL GLASS SM (MISCELLANEOUS) ×2 IMPLANT
DERMABOND ADVANCED (GAUZE/BANDAGES/DRESSINGS) ×2
DERMABOND ADVANCED .7 DNX12 (GAUZE/BANDAGES/DRESSINGS) ×1 IMPLANT
DRAIN PENROSE 1/4X12 LTX STRL (WOUND CARE) ×2 IMPLANT
ELECT REM PT RETURN 9FT ADLT (ELECTROSURGICAL) ×2
ELECTRODE REM PT RTRN 9FT ADLT (ELECTROSURGICAL) ×1 IMPLANT
GEL ULTRASOUND 20GR AQUASONIC (MISCELLANEOUS) IMPLANT
GLOVE BIO SURGEON STRL SZ7.5 (GLOVE) ×2 IMPLANT
GLOVE BIOGEL M 6.5 STRL (GLOVE) ×2 IMPLANT
GLOVE BIOGEL PI IND STRL 6.5 (GLOVE) IMPLANT
GLOVE BIOGEL PI IND STRL 7.5 (GLOVE) IMPLANT
GLOVE BIOGEL PI IND STRL 8 (GLOVE) IMPLANT
GLOVE BIOGEL PI INDICATOR 6.5 (GLOVE) ×2
GLOVE BIOGEL PI INDICATOR 7.5 (GLOVE) ×1
GLOVE BIOGEL PI INDICATOR 8 (GLOVE) ×1
GLOVE ECLIPSE 6.0 STRL STRAW (GLOVE) ×1 IMPLANT
GLOVE SURG SS PI 7.5 STRL IVOR (GLOVE) ×2 IMPLANT
GOWN PREVENTION PLUS XLARGE (GOWN DISPOSABLE) ×2 IMPLANT
GOWN STRL NON-REIN LRG LVL3 (GOWN DISPOSABLE) ×5 IMPLANT
KIT BASIN OR (CUSTOM PROCEDURE TRAY) ×2 IMPLANT
KIT ROOM TURNOVER OR (KITS) ×2 IMPLANT
LOOP VESSEL MINI RED (MISCELLANEOUS) IMPLANT
NS IRRIG 1000ML POUR BTL (IV SOLUTION) ×2 IMPLANT
PACK CV ACCESS (CUSTOM PROCEDURE TRAY) ×2 IMPLANT
PAD ARMBOARD 7.5X6 YLW CONV (MISCELLANEOUS) ×4 IMPLANT
SPONGE SURGIFOAM ABS GEL 100 (HEMOSTASIS) IMPLANT
SUT PROLENE 7 0 BV 1 (SUTURE) ×2 IMPLANT
SUT VIC AB 3-0 SH 27 (SUTURE) ×4
SUT VIC AB 3-0 SH 27X BRD (SUTURE) ×1 IMPLANT
SUT VICRYL 4-0 PS2 18IN ABS (SUTURE) ×3 IMPLANT
TOWEL OR 17X24 6PK STRL BLUE (TOWEL DISPOSABLE) ×2 IMPLANT
TOWEL OR 17X26 10 PK STRL BLUE (TOWEL DISPOSABLE) ×2 IMPLANT
UNDERPAD 30X30 INCONTINENT (UNDERPADS AND DIAPERS) ×2 IMPLANT
WATER STERILE IRR 1000ML POUR (IV SOLUTION) ×2 IMPLANT

## 2012-07-23 NOTE — Preoperative (Signed)
Beta Blockers   Reason not to administer Beta Blockers:Not Applicable 

## 2012-07-23 NOTE — Op Note (Signed)
Procedure: #1 Superficialization right brachiocephalic AV fistula  #2 ligation of multiple side branches  Preoperative Diagnosis: Non-maturing right brachiocephalic AV fistula  Postoperative diagnosis: Same  Anesthesia: General  Asst: Samantha Rhyne PAC  Findings 6-8 mm diameter fistula  Operative details: After obtaining informed consent, the patient was taken to the operating room. The patient was placed in supine position on the operating room table. After induction of general anesthesia and placement of a laryngeal mask the patient's entire right upper extremity was prepped and draped in the usual sterile fashion. A longitudinal incision was made near the right antecubital crease. The incision was carried into the subcutaneous tissues down to level of the right arm AV fistula.  The cephalic vein was dissected free circumferentially.  It was 6-8 mm diameter throughout its course.  I then proceeded to dissect out the entire cephalic vein in the upper arm through several skip incisions of the arm. There were 3 discrete side branches all of which were 1-2 mm in diameter and these were all ligated and divided between silk ties. The vein was fully mobilized circumferentially so it could rise to the skin surface. The subcutaneous fat was debrided underneath the skin edges and adjacent to the incisions.  The upper arm incisions were closed with running 4-0 Vicryl subcuticular stitch. Dermabond was applied to all incisions. Patient tolerated the procedure well and there were no complications. Instrument, sponge, and  needle counts were correct at the end of the case. Patient had a palpable thrill in the fistula at the end of the case.  Fabienne Bruns, MD [facility] [place] Office: [phone] Pager: [phone]

## 2012-07-23 NOTE — Interval H&P Note (Signed)
History and Physical Interval Note:  07/23/2012 7:30 AM  Jeffrey Manning  has presented today for surgery, with the diagnosis of End Stage Renal Disease  The various methods of treatment have been discussed with the patient and family. After consideration of risks, benefits and other options for treatment, the patient has consented to  Procedure(s) with comments: REVISON OF ARTERIOVENOUS FISTULA (Right) - Superficialize Right Upper Arm AVF as a surgical intervention .  The patient's history has been reviewed, patient examined, no change in status, stable for surgery.  I have reviewed the patient's chart and labs.  Questions were answered to the patient's satisfaction.     FIELDS,[name]

## 2012-07-23 NOTE — Telephone Encounter (Addendum)
Message copied by Rosalyn Charters on Mon Jul 23, 2012  4:18 PM ------      Message from: [place], [place] K      Created: Mon Jul 23, 2012  2:25 PM      Regarding: schedule                   ----- Message -----         From: Dara Lords, PA-C         Sent: 07/23/2012   1:42 PM           To: Sharee Pimple, CMA            S/p superficialization RUA AVG 07/23/12.  F/u with Dr. Darrick Penna in 2 weeks.            Thanks,      Samantha ------  notified pt. of fu appt. with cef on 08-09-12

## 2012-07-23 NOTE — Transfer of Care (Signed)
Immediate Anesthesia Transfer of Care Note  Patient: Jeffrey Manning  Procedure(s) Performed: Procedure(s) with comments: FISTULA SUPERFICIALIZATION (Right) - Superficialize Right Upper Arm Arteriovenous Fistula  Patient Location: PACU  Anesthesia Type:General  Level of Consciousness: awake and alert   Airway & Oxygen Therapy: Patient Spontanous Breathing and Patient connected to face mask oxygen  Post-op Assessment: Report given to PACU RN and Post -op Vital signs reviewed and stable  Post vital signs: Reviewed and stable  Complications: No apparent anesthesia complications

## 2012-07-23 NOTE — Anesthesia Procedure Notes (Signed)
Procedure Name: Intubation Date/Time: 07/23/2012 12:23 PM Performed by: Darcey Nora B Pre-anesthesia Checklist: Patient identified, Emergency Drugs available, Suction available and Patient being monitored Patient Re-evaluated:Patient Re-evaluated prior to inductionOxygen Delivery Method: Circle system utilized Preoxygenation: Pre-oxygenation with 100% oxygen Intubation Type: IV induction Ventilation: Mask ventilation without difficulty Laryngoscope Size: Mac and 4 Grade View: Grade II Tube type: Oral Tube size: 7.5 mm Number of attempts: 1 Airway Equipment and Method: Stylet Placement Confirmation: ETT inserted through vocal cords under direct vision,  breath sounds checked- equal and bilateral and positive ETCO2 (very stiff epiglottis) Secured at: 22 (cm at teeth) cm Tube secured with: Tape Dental Injury: Teeth and Oropharynx as per pre-operative assessment  Difficulty Due To: Difficult Airway- due to immobile epiglottis

## 2012-07-23 NOTE — H&P (View-Only) (Signed)
Patient is a 67 year old oh returns for followup today. He had a left sided catheter placed and a right brachiocephalic AV fistula placed on February 12. He reports no problems with his catheter. He reports no numbness or tingling in his right hand.  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 Risks benefits possible complications and procedure details discussed with the patient today. He wished to proceed.  Fabienne Bruns, MD [facility] Office: [phone] Pager: [phone]

## 2012-07-23 NOTE — Anesthesia Preprocedure Evaluation (Addendum)
Anesthesia Evaluation  Patient identified by MRN, date of birth, ID band Patient awake    Reviewed: Allergy & Precautions, H&P , NPO status , Patient's Chart, lab work & pertinent test results, reviewed documented beta blocker date and time   Airway Mallampati: II TM Distance: >3 FB Neck ROM: full    Dental  (+) Poor Dentition and Dental Advisory Given   Pulmonary sleep apnea , former smoker,  Pt states he has not used CPAP in many years b/c he got better.         Cardiovascular hypertension, Pt. on medications + angina + Past MI + dysrhythmias Rhythm:regular Rate:Normal     Neuro/Psych    GI/Hepatic   Endo/Other  diabetes, Type 2, Oral Hypoglycemic Agents  Renal/GU ESRF and DialysisRenal diseaseT-TH-SAT, diatek left chest     Musculoskeletal   Abdominal   Peds  Hematology   Anesthesia Other Findings Scraggly beard  Reproductive/Obstetrics                          Anesthesia Physical Anesthesia Plan  ASA: III  Anesthesia Plan: General   Post-op Pain Management:    Induction: Intravenous  Airway Management Planned: Oral ETT  Additional Equipment:   Intra-op Plan:   Post-operative Plan: Extubation in OR  Informed Consent: I have reviewed the patients History and Physical, chart, labs and discussed the procedure including the risks, benefits and alternatives for the proposed anesthesia with the patient or authorized representative who has indicated his/her understanding and acceptance.     Plan Discussed with: CRNA, Anesthesiologist and Surgeon  Anesthesia Plan Comments:         Anesthesia Quick Evaluation

## 2012-07-23 NOTE — Anesthesia Postprocedure Evaluation (Signed)
  Anesthesia Post-op Note  Patient: Jeffrey Manning  Procedure(s) Performed: Procedure(s) with comments: FISTULA SUPERFICIALIZATION (Right) - Superficialize Right Upper Arm Arteriovenous Fistula  Patient Location: PACU  Anesthesia Type:General  Level of Consciousness: awake, oriented and patient cooperative  Airway and Oxygen Therapy: Patient Spontanous Breathing  Post-op Pain: mild  Post-op Assessment: Post-op Vital signs reviewed, Patient's Cardiovascular Status Stable, Respiratory Function Stable, Patent Airway, No signs of Nausea or vomiting and Pain level controlled  Post-op Vital Signs: stable  Complications: No apparent anesthesia complications

## 2012-08-08 ENCOUNTER — Encounter: Payer: Self-pay | Admitting: Vascular Surgery

## 2012-08-09 ENCOUNTER — Encounter: Payer: Self-pay | Admitting: Vascular Surgery

## 2012-08-09 ENCOUNTER — Ambulatory Visit: Payer: Medicare Other | Admitting: Vascular Surgery

## 2012-08-09 ENCOUNTER — Ambulatory Visit (INDEPENDENT_AMBULATORY_CARE_PROVIDER_SITE_OTHER): Payer: Medicare Other | Admitting: Vascular Surgery

## 2012-08-09 VITALS — BP 81/43 | HR 71 | Resp 16 | Ht 72.0 in | Wt 281.0 lb

## 2012-08-09 DIAGNOSIS — N186 End stage renal disease: Secondary | ICD-10-CM

## 2012-08-09 DIAGNOSIS — R209 Unspecified disturbances of skin sensation: Secondary | ICD-10-CM

## 2012-08-09 DIAGNOSIS — R202 Paresthesia of skin: Secondary | ICD-10-CM

## 2012-08-09 DIAGNOSIS — M79609 Pain in unspecified limb: Secondary | ICD-10-CM

## 2012-08-09 NOTE — Progress Notes (Signed)
Patient is a 67 year old oh returns for followup today. He had a left sided catheter placed and a right brachiocephalic AV fistula placed on February 12. He reports no problems with his catheter. He underwent Superficialization side branch ligation of the fistula approximately 2 weeks ago. He returns today for further followup. He complains that he has had some numbness tingling and coolness in his right hand. He states this is bothersome to him but not disabling to him. Some days it is worse than others. It does not appear to get worse on dialysis.  Physical exam:  Filed Vitals:   08/09/12 1300  BP: 81/43  Pulse: 71  Resp: 16  Height: 6' (1.829 m)  Weight: 281 lb (127.461 kg)  SpO2: 98%   Right upper extremity the distal to the incisions are well-healed. There is still some superficial separation of the more proximal incision but the wound is less than 1 mm in depth. There is palpable thrill and audible bruit in the fistula.  Neuro: Slightly decreased hand intrinsics strength right hand compared to left but this is subtle  Assessment: Healing incisions recent Superficialization of right brachiocephalic AV fistula  Plan: Followup in 3 weeks with duplex of his AV fistula at that time and consider whether or not it is ready to cannulate. We will also reevaluate his right hand to make sure that the symptoms are either improved or not getting worse.  Fabienne Bruns, MD [facility] Office: [phone] Pager: [phone]

## 2012-08-16 ENCOUNTER — Other Ambulatory Visit: Payer: Self-pay | Admitting: *Deleted

## 2012-08-16 DIAGNOSIS — Z4931 Encounter for adequacy testing for hemodialysis: Secondary | ICD-10-CM

## 2012-08-16 DIAGNOSIS — N186 End stage renal disease: Secondary | ICD-10-CM

## 2012-08-29 ENCOUNTER — Encounter: Payer: Self-pay | Admitting: Vascular Surgery

## 2012-08-30 ENCOUNTER — Ambulatory Visit: Payer: Medicare Other | Admitting: Vascular Surgery

## 2012-09-05 ENCOUNTER — Encounter: Payer: Self-pay | Admitting: Vascular Surgery

## 2012-09-06 ENCOUNTER — Ambulatory Visit (INDEPENDENT_AMBULATORY_CARE_PROVIDER_SITE_OTHER): Payer: Medicare Other | Admitting: Vascular Surgery

## 2012-09-06 ENCOUNTER — Encounter: Payer: Self-pay | Admitting: Vascular Surgery

## 2012-09-06 ENCOUNTER — Encounter (INDEPENDENT_AMBULATORY_CARE_PROVIDER_SITE_OTHER): Payer: Medicare Other | Admitting: *Deleted

## 2012-09-06 VITALS — BP 118/35 | HR 90 | Ht 72.0 in | Wt 281.0 lb

## 2012-09-06 DIAGNOSIS — N186 End stage renal disease: Secondary | ICD-10-CM

## 2012-09-06 DIAGNOSIS — Z4931 Encounter for adequacy testing for hemodialysis: Secondary | ICD-10-CM

## 2012-09-06 DIAGNOSIS — T82898A Other specified complication of vascular prosthetic devices, implants and grafts, initial encounter: Secondary | ICD-10-CM

## 2012-09-06 DIAGNOSIS — T82598A Other mechanical complication of other cardiac and vascular devices and implants, initial encounter: Secondary | ICD-10-CM

## 2012-09-06 NOTE — Progress Notes (Signed)
  VASCULAR AND VEIN SURGERY PROGRESS NOTE  POST-OP HEMODIALYSIS ACCESS  Date of Surgery: 07/23/12 Superficialization of Right B-C AVF (placed 05/02/12)  HPI: Jeffrey Manning is a 67 y.o. male who is  creation/revision of right upper extremity Hemodialysis access. The patient complains of symptoms of numbness in 4th and 5th fingers. He states he can use hand normally but fine motor in thumb and 2nd finger decreased when he grips pen. He also states hand is sometimes cold. These symptoms are but tolerable to the pt and do not worsen with HD; denies pain in the operative limb. Pt has left IDC catheter.  Vital Signs  BP Readings from Last 3 Encounters:  09/06/12 118/35  08/09/12 81/43  07/23/12 124/46   Temp Readings from Last 3 Encounters:  07/23/12 97.4 F (36.3 C) Oral  07/23/12 97.4 F (36.3 C) Oral  07/12/12 98.2 F (36.8 C) Oral   SpO2 Readings from Last 3 Encounters:  09/06/12 100%  08/09/12 98%  07/23/12 99%   Pulse Readings from Last 3 Encounters:  09/06/12 90  08/09/12 71  07/23/12 67     Physical Examination  Right upper arm Incision is healing well, skin color is normal, no cyanosis, jaundice, pallor or bruising, normal , hand grip is 5/5, sensation in digits is intact;  There is a good thrill and good bruit in the AVF. Wounds well healed  Assessment/Plan Jeffrey Manning is a 67 y.o. year old male who presents s/p creation/revision of right upper extremity Hemodialysis access. Pt has mild steal symptoms of coolness in the hand and some intermittent numbness. Follow-up as needed  The patient's access will be ready for use in 3 weeks.  FIELDS,Loris E 09/06/2012 2:11 PM  Patient is status post Superficialization. The fistula is palpable in the upper arm. His incisions have not completely healed. The fistula should be ready for cannulation approximately 3 weeks. If they're unable to cannulate the fistula at that time we will need to consider him for an AV  graft. Mild steal symptoms observe for now.  Fabienne Bruns, MD [facility] Office: [phone] Pager: [phone]

## 2012-10-18 ENCOUNTER — Ambulatory Visit: Payer: Medicare Other | Admitting: Vascular Surgery

## 2012-11-01 ENCOUNTER — Other Ambulatory Visit: Payer: Self-pay | Admitting: *Deleted

## 2012-11-01 DIAGNOSIS — N186 End stage renal disease: Secondary | ICD-10-CM

## 2012-11-01 DIAGNOSIS — Z4931 Encounter for adequacy testing for hemodialysis: Secondary | ICD-10-CM

## 2012-11-21 ENCOUNTER — Encounter: Payer: Self-pay | Admitting: Vascular Surgery

## 2012-11-22 ENCOUNTER — Encounter: Payer: Self-pay | Admitting: Vascular Surgery

## 2012-11-22 ENCOUNTER — Encounter (INDEPENDENT_AMBULATORY_CARE_PROVIDER_SITE_OTHER): Payer: Medicare Other | Admitting: Vascular Surgery

## 2012-11-22 ENCOUNTER — Ambulatory Visit (INDEPENDENT_AMBULATORY_CARE_PROVIDER_SITE_OTHER): Payer: Self-pay | Admitting: Vascular Surgery

## 2012-11-22 VITALS — BP 132/54 | HR 80 | Resp 18 | Ht 72.0 in | Wt 291.0 lb

## 2012-11-22 DIAGNOSIS — N186 End stage renal disease: Secondary | ICD-10-CM

## 2012-11-22 DIAGNOSIS — Z4931 Encounter for adequacy testing for hemodialysis: Secondary | ICD-10-CM

## 2012-11-22 NOTE — Progress Notes (Signed)
  VASCULAR AND VEIN SURGERY PROGRESS NOTE  POST-OP HEMODIALYSIS ACCESS  Date of Surgery: RUE AVF superficialization  Surgeon: CEF   HPI: Jeffrey Manning is a 67 y.o. male who is 2 months post AVF superficialization creation/revision of right upper extremity Hemodialysis access. The patient complains of symptoms of numbness, coolness in hand; This is unchanged from previous visit. He can use his hand normally although his writing is worse and he  denies pain in the operative limb.   They have been successfully canalizing the fistula. 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  Vital Signs  BP Readings from Last 3 Encounters:  11/22/12 132/54  09/06/12 118/35  08/09/12 81/43   Temp Readings from Last 3 Encounters:  07/23/12 97.4 F (36.3 C) Oral  07/23/12 97.4 F (36.3 C) Oral  07/12/12 98.2 F (36.8 C) Oral   SpO2 Readings from Last 3 Encounters:  09/06/12 100%  08/09/12 98%  07/23/12 99%   Pulse Readings from Last 3 Encounters:  11/22/12 80  09/06/12 90  08/09/12 71     Physical Examination  right upper Incision is healing well, skin color is normal, no cyanosis, jaundice, pallor or bruising , hand grip is 5/5, sensation in digits is intact;  There is a good thrill and good bruit in the RUE BC fistula. Both hands are equally warm and there is no muscle wasting noted  Duplex of graft shows good flow/depth and size of fistula. Some narrowing with increased velocities (501) Near axilla  Assessment/Plan Jeffrey Manning is a 67 y.o. year old male who presents s/p creation/revision of right upper extremity Hemodialysis access. He has moderate steal symptoms which are tolerable at this time and only minimally affect the function of his right ahnd Follow-up as needed The patient's access is being used  We will pull HD catheter once the dialysis center is happy with AVF function. Pt to wear glove on right and continue exercises He will  return if steal symptoms become intolerable or he has further deterioration of hand function  Jeffrey Manning J 11/22/2012 4:08 PM  History and exam details as above. He will followup on as-needed basis.  Fabienne Bruns, MD [facility] Office: [phone] Pager: [phone]

## 2013-06-27 ENCOUNTER — Other Ambulatory Visit: Payer: Self-pay | Admitting: Vascular Surgery

## 2013-06-27 DIAGNOSIS — N186 End stage renal disease: Secondary | ICD-10-CM

## 2013-06-27 DIAGNOSIS — Z01810 Encounter for preprocedural cardiovascular examination: Secondary | ICD-10-CM

## 2013-07-30 ENCOUNTER — Encounter: Payer: Self-pay | Admitting: Vascular Surgery

## 2013-07-31 ENCOUNTER — Encounter (HOSPITAL_COMMUNITY): Payer: Medicare Other

## 2013-07-31 ENCOUNTER — Ambulatory Visit: Payer: Medicare Other | Admitting: Vascular Surgery

## 2013-07-31 ENCOUNTER — Other Ambulatory Visit (HOSPITAL_COMMUNITY): Payer: Medicare Other

## 2013-09-11 ENCOUNTER — Inpatient Hospital Stay
Admission: AD | Admit: 2013-09-11 | Discharge: 2013-10-19 | Disposition: E | Payer: Self-pay | Source: Ambulatory Visit | Attending: Internal Medicine | Admitting: Internal Medicine

## 2013-09-11 DIAGNOSIS — R933 Abnormal findings on diagnostic imaging of other parts of digestive tract: Secondary | ICD-10-CM

## 2013-09-11 DIAGNOSIS — K567 Ileus, unspecified: Secondary | ICD-10-CM

## 2013-09-11 HISTORY — DX: Major depressive disorder, single episode, unspecified: F32.9

## 2013-09-11 HISTORY — DX: Other mental disorders complicating pregnancy, second trimester: O99.342

## 2013-09-11 HISTORY — DX: Depression, unspecified: F32.A

## 2013-09-12 ENCOUNTER — Encounter: Payer: Self-pay | Admitting: Vascular Surgery

## 2013-09-12 ENCOUNTER — Other Ambulatory Visit (HOSPITAL_COMMUNITY): Payer: Self-pay

## 2013-09-12 LAB — CBC WITH DIFFERENTIAL/PLATELET
Basophils Absolute: 0.1 10*3/uL (ref 0.0–0.1)
Basophils Relative: 1 % (ref 0–1)
EOS PCT: 1 % (ref 0–5)
Eosinophils Absolute: 0.1 10*3/uL (ref 0.0–0.7)
HCT: 33.7 % — ABNORMAL LOW (ref 39.0–52.0)
HEMOGLOBIN: 10.4 g/dL — AB (ref 13.0–17.0)
Lymphocytes Relative: 8 % — ABNORMAL LOW (ref 12–46)
Lymphs Abs: 0.5 10*3/uL — ABNORMAL LOW (ref 0.7–4.0)
MCH: 30.1 pg (ref 26.0–34.0)
MCHC: 30.9 g/dL (ref 30.0–36.0)
MCV: 97.7 fL (ref 78.0–100.0)
Monocytes Absolute: 0.6 10*3/uL (ref 0.1–1.0)
Monocytes Relative: 9 % (ref 3–12)
NEUTROS PCT: 81 % — AB (ref 43–77)
Neutro Abs: 5.2 10*3/uL (ref 1.7–7.7)
Platelets: 226 10*3/uL (ref 150–400)
RBC: 3.45 MIL/uL — AB (ref 4.22–5.81)
RDW: 15.7 % — ABNORMAL HIGH (ref 11.5–15.5)
WBC: 6.5 10*3/uL (ref 4.0–10.5)

## 2013-09-12 LAB — COMPREHENSIVE METABOLIC PANEL
ALK PHOS: 63 U/L (ref 39–117)
ALT: 17 U/L (ref 0–53)
AST: 15 U/L (ref 0–37)
Albumin: 2.8 g/dL — ABNORMAL LOW (ref 3.5–5.2)
BUN: 39 mg/dL — AB (ref 6–23)
CO2: 26 meq/L (ref 19–32)
Calcium: 9.8 mg/dL (ref 8.4–10.5)
Chloride: 93 mEq/L — ABNORMAL LOW (ref 96–112)
Creatinine, Ser: 9.66 mg/dL — ABNORMAL HIGH (ref 0.50–1.35)
GFR, EST AFRICAN AMERICAN: 6 mL/min — AB (ref >90)
GFR, EST NON AFRICAN AMERICAN: 5 mL/min — AB (ref >90)
Glucose, Bld: 121 mg/dL — ABNORMAL HIGH (ref 70–99)
Potassium: 4.3 mEq/L (ref 3.7–5.3)
SODIUM: 144 meq/L (ref 137–147)
Total Bilirubin: 0.3 mg/dL (ref 0.3–1.2)
Total Protein: 6.3 g/dL (ref 6.0–8.3)

## 2013-09-12 LAB — HEMOGLOBIN A1C
HEMOGLOBIN A1C: 7.1 % — AB (ref <5.7)
Mean Plasma Glucose: 157 mg/dL — ABNORMAL HIGH (ref <117)

## 2013-09-12 LAB — PREALBUMIN: Prealbumin: 18.5 mg/dL (ref 17.0–34.0)

## 2013-09-12 LAB — MAGNESIUM: MAGNESIUM: 2.4 mg/dL (ref 1.5–2.5)

## 2013-09-12 LAB — PHOSPHORUS: Phosphorus: 9.8 mg/dL — ABNORMAL HIGH (ref 2.3–4.6)

## 2013-09-12 LAB — T4, FREE: FREE T4: 1.29 ng/dL (ref 0.80–1.80)

## 2013-09-12 LAB — PROCALCITONIN: Procalcitonin: 3.33 ng/mL

## 2013-09-12 LAB — VITAMIN B12: VITAMIN B 12: 1848 pg/mL — AB (ref 211–911)

## 2013-09-12 LAB — PROTIME-INR
INR: 1.2 (ref 0.00–1.49)
Prothrombin Time: 15.2 seconds (ref 11.6–15.2)

## 2013-09-12 LAB — APTT: aPTT: 26 seconds (ref 24–37)

## 2013-09-12 LAB — TSH: TSH: 0.671 u[IU]/mL (ref 0.350–4.500)

## 2013-09-12 LAB — FERRITIN: Ferritin: 168 ng/mL (ref 22–322)

## 2013-09-13 ENCOUNTER — Ambulatory Visit: Payer: Medicare Other | Admitting: Vascular Surgery

## 2013-09-13 ENCOUNTER — Other Ambulatory Visit (HOSPITAL_COMMUNITY): Payer: Medicare Other

## 2013-09-13 ENCOUNTER — Encounter (HOSPITAL_COMMUNITY): Payer: Medicare Other

## 2013-09-13 LAB — COMPREHENSIVE METABOLIC PANEL
ALT: 14 U/L (ref 0–53)
AST: 10 U/L (ref 0–37)
Albumin: 2.5 g/dL — ABNORMAL LOW (ref 3.5–5.2)
Alkaline Phosphatase: 57 U/L (ref 39–117)
BILIRUBIN TOTAL: 0.2 mg/dL — AB (ref 0.3–1.2)
BUN: 46 mg/dL — AB (ref 6–23)
CALCIUM: 9.2 mg/dL (ref 8.4–10.5)
CHLORIDE: 94 meq/L — AB (ref 96–112)
CO2: 29 meq/L (ref 19–32)
Creatinine, Ser: 11.51 mg/dL — ABNORMAL HIGH (ref 0.50–1.35)
GFR, EST AFRICAN AMERICAN: 5 mL/min — AB (ref >90)
GFR, EST NON AFRICAN AMERICAN: 4 mL/min — AB (ref >90)
Glucose, Bld: 149 mg/dL — ABNORMAL HIGH (ref 70–99)
Potassium: 4.1 mEq/L (ref 3.7–5.3)
Sodium: 142 mEq/L (ref 137–147)
Total Protein: 5.6 g/dL — ABNORMAL LOW (ref 6.0–8.3)

## 2013-09-13 LAB — PREALBUMIN: PREALBUMIN: 16.4 mg/dL — AB (ref 17.0–34.0)

## 2013-09-13 LAB — MAGNESIUM: MAGNESIUM: 2.5 mg/dL (ref 1.5–2.5)

## 2013-09-13 LAB — CBC WITH DIFFERENTIAL/PLATELET
Basophils Absolute: 0 10*3/uL (ref 0.0–0.1)
Basophils Relative: 1 % (ref 0–1)
Eosinophils Absolute: 0.1 10*3/uL (ref 0.0–0.7)
Eosinophils Relative: 2 % (ref 0–5)
HCT: 28.2 % — ABNORMAL LOW (ref 39.0–52.0)
Hemoglobin: 8.8 g/dL — ABNORMAL LOW (ref 13.0–17.0)
LYMPHS ABS: 0.5 10*3/uL — AB (ref 0.7–4.0)
LYMPHS PCT: 10 % — AB (ref 12–46)
MCH: 29.3 pg (ref 26.0–34.0)
MCHC: 31.2 g/dL (ref 30.0–36.0)
MCV: 94 fL (ref 78.0–100.0)
MONO ABS: 0.6 10*3/uL (ref 0.1–1.0)
Monocytes Relative: 10 % (ref 3–12)
Neutro Abs: 4.3 10*3/uL (ref 1.7–7.7)
Neutrophils Relative %: 77 % (ref 43–77)
PLATELETS: 234 10*3/uL (ref 150–400)
RBC: 3 MIL/uL — AB (ref 4.22–5.81)
RDW: 15.5 % (ref 11.5–15.5)
WBC: 5.6 10*3/uL (ref 4.0–10.5)

## 2013-09-13 LAB — PRO B NATRIURETIC PEPTIDE: PRO B NATRI PEPTIDE: 11529 pg/mL — AB (ref 0–125)

## 2013-09-13 LAB — PHOSPHORUS
PHOSPHORUS: 10.5 mg/dL — AB (ref 2.3–4.6)
Phosphorus: 10.5 mg/dL — ABNORMAL HIGH (ref 2.3–4.6)

## 2013-09-13 LAB — TRIGLYCERIDES: Triglycerides: 121 mg/dL (ref <150)

## 2013-09-13 LAB — FOLATE RBC: RBC Folate: 589 ng/mL (ref >280)

## 2013-09-14 ENCOUNTER — Other Ambulatory Visit (HOSPITAL_COMMUNITY): Payer: Self-pay

## 2013-09-14 LAB — CBC WITH DIFFERENTIAL/PLATELET
BASOS PCT: 1 % (ref 0–1)
Basophils Absolute: 0 10*3/uL (ref 0.0–0.1)
EOS PCT: 1 % (ref 0–5)
Eosinophils Absolute: 0.1 10*3/uL (ref 0.0–0.7)
HCT: 29.4 % — ABNORMAL LOW (ref 39.0–52.0)
Hemoglobin: 9.3 g/dL — ABNORMAL LOW (ref 13.0–17.0)
Lymphocytes Relative: 9 % — ABNORMAL LOW (ref 12–46)
Lymphs Abs: 0.5 10*3/uL — ABNORMAL LOW (ref 0.7–4.0)
MCH: 30.5 pg (ref 26.0–34.0)
MCHC: 31.6 g/dL (ref 30.0–36.0)
MCV: 96.4 fL (ref 78.0–100.0)
Monocytes Absolute: 0.5 10*3/uL (ref 0.1–1.0)
Monocytes Relative: 9 % (ref 3–12)
Neutro Abs: 4.5 10*3/uL (ref 1.7–7.7)
Neutrophils Relative %: 80 % — ABNORMAL HIGH (ref 43–77)
PLATELETS: 207 10*3/uL (ref 150–400)
RBC: 3.05 MIL/uL — ABNORMAL LOW (ref 4.22–5.81)
RDW: 15.4 % (ref 11.5–15.5)
WBC: 5.6 10*3/uL (ref 4.0–10.5)

## 2013-09-14 LAB — PHOSPHORUS: Phosphorus: 7.6 mg/dL — ABNORMAL HIGH (ref 2.3–4.6)

## 2013-09-14 LAB — BASIC METABOLIC PANEL
BUN: 37 mg/dL — ABNORMAL HIGH (ref 6–23)
CALCIUM: 8.9 mg/dL (ref 8.4–10.5)
CO2: 28 mEq/L (ref 19–32)
Chloride: 96 mEq/L (ref 96–112)
Creatinine, Ser: 9.99 mg/dL — ABNORMAL HIGH (ref 0.50–1.35)
GFR calc non Af Amer: 5 mL/min — ABNORMAL LOW (ref >90)
GFR, EST AFRICAN AMERICAN: 5 mL/min — AB (ref >90)
Glucose, Bld: 214 mg/dL — ABNORMAL HIGH (ref 70–99)
POTASSIUM: 4.1 meq/L (ref 3.7–5.3)
SODIUM: 142 meq/L (ref 137–147)

## 2013-09-14 LAB — HEPATITIS B SURFACE ANTIGEN: HEP B S AG: NEGATIVE

## 2013-09-14 LAB — HEPATITIS B SURFACE ANTIBODY,QUALITATIVE: HEP B S AB: NEGATIVE

## 2013-09-14 LAB — MAGNESIUM: Magnesium: 2.3 mg/dL (ref 1.5–2.5)

## 2013-09-16 ENCOUNTER — Other Ambulatory Visit (HOSPITAL_COMMUNITY): Payer: Self-pay

## 2013-09-16 LAB — RENAL FUNCTION PANEL
ALBUMIN: 2.3 g/dL — AB (ref 3.5–5.2)
BUN: 40 mg/dL — ABNORMAL HIGH (ref 6–23)
CHLORIDE: 92 meq/L — AB (ref 96–112)
CO2: 23 mEq/L (ref 19–32)
Calcium: 8.8 mg/dL (ref 8.4–10.5)
Creatinine, Ser: 9.38 mg/dL — ABNORMAL HIGH (ref 0.50–1.35)
GFR calc Af Amer: 6 mL/min — ABNORMAL LOW (ref >90)
GFR, EST NON AFRICAN AMERICAN: 5 mL/min — AB (ref >90)
Glucose, Bld: 209 mg/dL — ABNORMAL HIGH (ref 70–99)
PHOSPHORUS: 8 mg/dL — AB (ref 2.3–4.6)
POTASSIUM: 5.7 meq/L — AB (ref 3.7–5.3)
Sodium: 136 mEq/L — ABNORMAL LOW (ref 137–147)

## 2013-09-16 LAB — COMPREHENSIVE METABOLIC PANEL
ALT: 12 U/L (ref 0–53)
AST: 33 U/L (ref 0–37)
Albumin: 2.4 g/dL — ABNORMAL LOW (ref 3.5–5.2)
Alkaline Phosphatase: 61 U/L (ref 39–117)
BUN: 58 mg/dL — ABNORMAL HIGH (ref 6–23)
CO2: 24 meq/L (ref 19–32)
CREATININE: 12.44 mg/dL — AB (ref 0.50–1.35)
Calcium: 9.1 mg/dL (ref 8.4–10.5)
Chloride: 91 mEq/L — ABNORMAL LOW (ref 96–112)
GFR, EST AFRICAN AMERICAN: 4 mL/min — AB (ref >90)
GFR, EST NON AFRICAN AMERICAN: 4 mL/min — AB (ref >90)
GLUCOSE: 199 mg/dL — AB (ref 70–99)
Potassium: 6.1 mEq/L — ABNORMAL HIGH (ref 3.7–5.3)
SODIUM: 136 meq/L — AB (ref 137–147)
TOTAL PROTEIN: 6.2 g/dL (ref 6.0–8.3)
Total Bilirubin: 0.3 mg/dL (ref 0.3–1.2)

## 2013-09-16 LAB — CBC WITH DIFFERENTIAL/PLATELET
Basophils Absolute: 0.1 10*3/uL (ref 0.0–0.1)
Basophils Relative: 1 % (ref 0–1)
EOS ABS: 0.2 10*3/uL (ref 0.0–0.7)
Eosinophils Relative: 3 % (ref 0–5)
HCT: 26.8 % — ABNORMAL LOW (ref 39.0–52.0)
Hemoglobin: 8.6 g/dL — ABNORMAL LOW (ref 13.0–17.0)
LYMPHS PCT: 9 % — AB (ref 12–46)
Lymphs Abs: 0.5 10*3/uL — ABNORMAL LOW (ref 0.7–4.0)
MCH: 30.1 pg (ref 26.0–34.0)
MCHC: 32.1 g/dL (ref 30.0–36.0)
MCV: 93.7 fL (ref 78.0–100.0)
MONOS PCT: 10 % (ref 3–12)
Monocytes Absolute: 0.5 10*3/uL (ref 0.1–1.0)
NEUTROS ABS: 3.9 10*3/uL (ref 1.7–7.7)
NEUTROS PCT: 77 % (ref 43–77)
PLATELETS: 142 10*3/uL — AB (ref 150–400)
RBC: 2.86 MIL/uL — ABNORMAL LOW (ref 4.22–5.81)
RDW: 15.3 % (ref 11.5–15.5)
WBC: 5.2 10*3/uL (ref 4.0–10.5)

## 2013-09-16 LAB — PTH, INTACT AND CALCIUM
Calcium, Total (PTH): 8.7 mg/dL (ref 8.4–10.5)
PTH: 731.2 pg/mL — AB (ref 14.0–72.0)

## 2013-09-16 LAB — PREALBUMIN: Prealbumin: 17.3 mg/dL — ABNORMAL LOW (ref 17.0–34.0)

## 2013-09-16 LAB — PHOSPHORUS: PHOSPHORUS: 11 mg/dL — AB (ref 2.3–4.6)

## 2013-09-16 LAB — MAGNESIUM: MAGNESIUM: 2.4 mg/dL (ref 1.5–2.5)

## 2013-09-16 LAB — TRIGLYCERIDES: TRIGLYCERIDES: 174 mg/dL — AB (ref <150)

## 2013-09-17 LAB — CBC
HEMATOCRIT: 29.3 % — AB (ref 39.0–52.0)
Hemoglobin: 9.1 g/dL — ABNORMAL LOW (ref 13.0–17.0)
MCH: 29.6 pg (ref 26.0–34.0)
MCHC: 31.1 g/dL (ref 30.0–36.0)
MCV: 95.4 fL (ref 78.0–100.0)
Platelets: 206 10*3/uL (ref 150–400)
RBC: 3.07 MIL/uL — ABNORMAL LOW (ref 4.22–5.81)
RDW: 15.3 % (ref 11.5–15.5)
WBC: 5.4 10*3/uL (ref 4.0–10.5)

## 2013-09-17 LAB — DIFFERENTIAL
BASOS PCT: 1 % (ref 0–1)
Basophils Absolute: 0 10*3/uL (ref 0.0–0.1)
EOS PCT: 3 % (ref 0–5)
Eosinophils Absolute: 0.1 10*3/uL (ref 0.0–0.7)
Lymphocytes Relative: 9 % — ABNORMAL LOW (ref 12–46)
Lymphs Abs: 0.5 10*3/uL — ABNORMAL LOW (ref 0.7–4.0)
MONO ABS: 0.4 10*3/uL (ref 0.1–1.0)
Monocytes Relative: 8 % (ref 3–12)
NEUTROS ABS: 4.3 10*3/uL (ref 1.7–7.7)
Neutrophils Relative %: 80 % — ABNORMAL HIGH (ref 43–77)

## 2013-09-17 LAB — TRIGLYCERIDES: Triglycerides: 70 mg/dL (ref <150)

## 2013-09-17 LAB — COMPREHENSIVE METABOLIC PANEL
ALT: 12 U/L (ref 0–53)
AST: 9 U/L (ref 0–37)
Albumin: 2.4 g/dL — ABNORMAL LOW (ref 3.5–5.2)
Alkaline Phosphatase: 70 U/L (ref 39–117)
BILIRUBIN TOTAL: 0.3 mg/dL (ref 0.3–1.2)
BUN: 49 mg/dL — ABNORMAL HIGH (ref 6–23)
CALCIUM: 9.1 mg/dL (ref 8.4–10.5)
CO2: 27 mEq/L (ref 19–32)
CREATININE: 10.15 mg/dL — AB (ref 0.50–1.35)
Chloride: 95 mEq/L — ABNORMAL LOW (ref 96–112)
GFR calc non Af Amer: 5 mL/min — ABNORMAL LOW (ref >90)
GFR, EST AFRICAN AMERICAN: 5 mL/min — AB (ref >90)
GLUCOSE: 181 mg/dL — AB (ref 70–99)
Potassium: 4.8 mEq/L (ref 3.7–5.3)
SODIUM: 138 meq/L (ref 137–147)
Total Protein: 5.9 g/dL — ABNORMAL LOW (ref 6.0–8.3)

## 2013-09-17 LAB — PHOSPHORUS: PHOSPHORUS: 6.4 mg/dL — AB (ref 2.3–4.6)

## 2013-09-17 LAB — PREALBUMIN: Prealbumin: 17.8 mg/dL — ABNORMAL LOW (ref 17.0–34.0)

## 2013-09-17 LAB — MAGNESIUM: Magnesium: 2.2 mg/dL (ref 1.5–2.5)

## 2013-09-18 ENCOUNTER — Other Ambulatory Visit (HOSPITAL_COMMUNITY): Payer: Self-pay

## 2013-09-18 LAB — CBC
HCT: 26.2 % — ABNORMAL LOW (ref 39.0–52.0)
Hemoglobin: 8.3 g/dL — ABNORMAL LOW (ref 13.0–17.0)
MCH: 29.9 pg (ref 26.0–34.0)
MCHC: 31.7 g/dL (ref 30.0–36.0)
MCV: 94.2 fL (ref 78.0–100.0)
PLATELETS: 162 10*3/uL (ref 150–400)
RBC: 2.78 MIL/uL — ABNORMAL LOW (ref 4.22–5.81)
RDW: 14.9 % (ref 11.5–15.5)
WBC: 4.3 10*3/uL (ref 4.0–10.5)

## 2013-09-18 LAB — COMPREHENSIVE METABOLIC PANEL
ALT: 10 U/L (ref 0–53)
AST: 8 U/L (ref 0–37)
Albumin: 2.1 g/dL — ABNORMAL LOW (ref 3.5–5.2)
Alkaline Phosphatase: 59 U/L (ref 39–117)
BUN: 75 mg/dL — ABNORMAL HIGH (ref 6–23)
CALCIUM: 8.9 mg/dL (ref 8.4–10.5)
CO2: 23 mEq/L (ref 19–32)
CREATININE: 11.21 mg/dL — AB (ref 0.50–1.35)
Chloride: 92 mEq/L — ABNORMAL LOW (ref 96–112)
GFR calc Af Amer: 5 mL/min — ABNORMAL LOW (ref >90)
GFR calc non Af Amer: 4 mL/min — ABNORMAL LOW (ref >90)
Glucose, Bld: 382 mg/dL — ABNORMAL HIGH (ref 70–99)
Potassium: 4.2 mEq/L (ref 3.7–5.3)
Sodium: 134 mEq/L — ABNORMAL LOW (ref 137–147)
Total Bilirubin: <0.2 mg/dL — ABNORMAL LOW (ref 0.3–1.2)
Total Protein: 5.3 g/dL — ABNORMAL LOW (ref 6.0–8.3)

## 2013-09-18 LAB — DIFFERENTIAL
BASOS ABS: 0 10*3/uL (ref 0.0–0.1)
Basophils Relative: 1 % (ref 0–1)
EOS PCT: 3 % (ref 0–5)
Eosinophils Absolute: 0.1 10*3/uL (ref 0.0–0.7)
LYMPHS PCT: 9 % — AB (ref 12–46)
Lymphs Abs: 0.4 10*3/uL — ABNORMAL LOW (ref 0.7–4.0)
Monocytes Absolute: 0.4 10*3/uL (ref 0.1–1.0)
Monocytes Relative: 10 % (ref 3–12)
NEUTROS PCT: 77 % (ref 43–77)
Neutro Abs: 3.3 10*3/uL (ref 1.7–7.7)

## 2013-09-18 LAB — MAGNESIUM: MAGNESIUM: 2.3 mg/dL (ref 1.5–2.5)

## 2013-09-18 LAB — TRIGLYCERIDES: Triglycerides: 93 mg/dL (ref <150)

## 2013-09-18 LAB — PHOSPHORUS: PHOSPHORUS: 7 mg/dL — AB (ref 2.3–4.6)

## 2013-09-18 DEATH — deceased

## 2013-09-19 LAB — DIFFERENTIAL
BASOS ABS: 0.1 10*3/uL (ref 0.0–0.1)
Basophils Relative: 1 % (ref 0–1)
EOS PCT: 4 % (ref 0–5)
Eosinophils Absolute: 0.2 10*3/uL (ref 0.0–0.7)
LYMPHS ABS: 0.6 10*3/uL — AB (ref 0.7–4.0)
LYMPHS PCT: 12 % (ref 12–46)
MONOS PCT: 8 % (ref 3–12)
Monocytes Absolute: 0.4 10*3/uL (ref 0.1–1.0)
NEUTROS PCT: 75 % (ref 43–77)
Neutro Abs: 3.9 10*3/uL (ref 1.7–7.7)

## 2013-09-19 LAB — CBC
HCT: 26.8 % — ABNORMAL LOW (ref 39.0–52.0)
Hemoglobin: 8.5 g/dL — ABNORMAL LOW (ref 13.0–17.0)
MCH: 29.4 pg (ref 26.0–34.0)
MCHC: 31.7 g/dL (ref 30.0–36.0)
MCV: 92.7 fL (ref 78.0–100.0)
Platelets: 175 10*3/uL (ref 150–400)
RBC: 2.89 MIL/uL — AB (ref 4.22–5.81)
RDW: 15 % (ref 11.5–15.5)
WBC: 5.2 10*3/uL (ref 4.0–10.5)

## 2013-09-19 LAB — COMPREHENSIVE METABOLIC PANEL
ALT: 10 U/L (ref 0–53)
AST: 12 U/L (ref 0–37)
Albumin: 2.3 g/dL — ABNORMAL LOW (ref 3.5–5.2)
Alkaline Phosphatase: 66 U/L (ref 39–117)
Anion gap: 17 — ABNORMAL HIGH (ref 5–15)
BUN: 71 mg/dL — ABNORMAL HIGH (ref 6–23)
CALCIUM: 9.9 mg/dL (ref 8.4–10.5)
CHLORIDE: 94 meq/L — AB (ref 96–112)
CO2: 25 meq/L (ref 19–32)
Creatinine, Ser: 9.5 mg/dL — ABNORMAL HIGH (ref 0.50–1.35)
GFR calc Af Amer: 6 mL/min — ABNORMAL LOW (ref >90)
GFR, EST NON AFRICAN AMERICAN: 5 mL/min — AB (ref >90)
Glucose, Bld: 260 mg/dL — ABNORMAL HIGH (ref 70–99)
Potassium: 4.1 mEq/L (ref 3.7–5.3)
SODIUM: 136 meq/L — AB (ref 137–147)
Total Bilirubin: <0.2 mg/dL — ABNORMAL LOW (ref 0.3–1.2)
Total Protein: 5.7 g/dL — ABNORMAL LOW (ref 6.0–8.3)

## 2013-09-19 LAB — PHOSPHORUS: Phosphorus: 4.1 mg/dL (ref 2.3–4.6)

## 2013-09-19 LAB — PREALBUMIN
Prealbumin: 17.9 mg/dL — ABNORMAL LOW (ref 17.0–34.0)
Prealbumin: 19.7 mg/dL (ref 17.0–34.0)

## 2013-09-19 LAB — MAGNESIUM: Magnesium: 2.6 mg/dL — ABNORMAL HIGH (ref 1.5–2.5)

## 2013-09-19 LAB — TRIGLYCERIDES: Triglycerides: 142 mg/dL (ref <150)

## 2013-09-20 ENCOUNTER — Other Ambulatory Visit (HOSPITAL_COMMUNITY): Payer: Self-pay

## 2013-09-20 LAB — COMPREHENSIVE METABOLIC PANEL
ALBUMIN: 2.3 g/dL — AB (ref 3.5–5.2)
ALT: 11 U/L (ref 0–53)
AST: 9 U/L (ref 0–37)
Alkaline Phosphatase: 71 U/L (ref 39–117)
Anion gap: 19 — ABNORMAL HIGH (ref 5–15)
BILIRUBIN TOTAL: 0.2 mg/dL — AB (ref 0.3–1.2)
BUN: 100 mg/dL — ABNORMAL HIGH (ref 6–23)
CHLORIDE: 92 meq/L — AB (ref 96–112)
CO2: 21 meq/L (ref 19–32)
CREATININE: 10.78 mg/dL — AB (ref 0.50–1.35)
Calcium: 10.3 mg/dL (ref 8.4–10.5)
GFR calc Af Amer: 5 mL/min — ABNORMAL LOW (ref >90)
GFR, EST NON AFRICAN AMERICAN: 4 mL/min — AB (ref >90)
Glucose, Bld: 348 mg/dL — ABNORMAL HIGH (ref 70–99)
POTASSIUM: 5.2 meq/L (ref 3.7–5.3)
SODIUM: 132 meq/L — AB (ref 137–147)
Total Protein: 6.1 g/dL (ref 6.0–8.3)

## 2013-09-20 LAB — MAGNESIUM: Magnesium: 3 mg/dL — ABNORMAL HIGH (ref 1.5–2.5)

## 2013-09-20 LAB — DIFFERENTIAL
BASOS ABS: 0 10*3/uL (ref 0.0–0.1)
Basophils Relative: 1 % (ref 0–1)
Eosinophils Absolute: 0.2 10*3/uL (ref 0.0–0.7)
Eosinophils Relative: 4 % (ref 0–5)
LYMPHS ABS: 0.4 10*3/uL — AB (ref 0.7–4.0)
LYMPHS PCT: 10 % — AB (ref 12–46)
MONO ABS: 0.4 10*3/uL (ref 0.1–1.0)
Monocytes Relative: 10 % (ref 3–12)
Neutro Abs: 3.3 10*3/uL (ref 1.7–7.7)
Neutrophils Relative %: 77 % (ref 43–77)

## 2013-09-20 LAB — PREALBUMIN: Prealbumin: 20.2 mg/dL (ref 17.0–34.0)

## 2013-09-20 LAB — CBC
HEMATOCRIT: 27.8 % — AB (ref 39.0–52.0)
Hemoglobin: 8.9 g/dL — ABNORMAL LOW (ref 13.0–17.0)
MCH: 29.3 pg (ref 26.0–34.0)
MCHC: 32 g/dL (ref 30.0–36.0)
MCV: 91.4 fL (ref 78.0–100.0)
PLATELETS: 173 10*3/uL (ref 150–400)
RBC: 3.04 MIL/uL — AB (ref 4.22–5.81)
RDW: 15 % (ref 11.5–15.5)
WBC: 4.3 10*3/uL (ref 4.0–10.5)

## 2013-09-20 LAB — PHOSPHORUS: Phosphorus: 3.6 mg/dL (ref 2.3–4.6)

## 2013-09-20 LAB — TRIGLYCERIDES: TRIGLYCERIDES: 85 mg/dL (ref <150)

## 2013-09-21 LAB — PREALBUMIN: Prealbumin: 18.4 mg/dL (ref 17.0–34.0)

## 2013-09-21 LAB — COMPREHENSIVE METABOLIC PANEL
ALBUMIN: 2.2 g/dL — AB (ref 3.5–5.2)
ALT: 10 U/L (ref 0–53)
AST: 8 U/L (ref 0–37)
Alkaline Phosphatase: 68 U/L (ref 39–117)
Anion gap: 15 (ref 5–15)
BILIRUBIN TOTAL: 0.2 mg/dL — AB (ref 0.3–1.2)
BUN: 64 mg/dL — AB (ref 6–23)
CHLORIDE: 94 meq/L — AB (ref 96–112)
CO2: 26 mEq/L (ref 19–32)
Calcium: 10.3 mg/dL (ref 8.4–10.5)
Creatinine, Ser: 6.93 mg/dL — ABNORMAL HIGH (ref 0.50–1.35)
GFR calc Af Amer: 9 mL/min — ABNORMAL LOW (ref >90)
GFR calc non Af Amer: 7 mL/min — ABNORMAL LOW (ref >90)
Glucose, Bld: 180 mg/dL — ABNORMAL HIGH (ref 70–99)
Potassium: 3.7 mEq/L (ref 3.7–5.3)
Sodium: 135 mEq/L — ABNORMAL LOW (ref 137–147)
Total Protein: 5.7 g/dL — ABNORMAL LOW (ref 6.0–8.3)

## 2013-09-21 LAB — DIFFERENTIAL
Basophils Absolute: 0 10*3/uL (ref 0.0–0.1)
Basophils Relative: 0 % (ref 0–1)
Eosinophils Absolute: 0.2 10*3/uL (ref 0.0–0.7)
Eosinophils Relative: 5 % (ref 0–5)
LYMPHS PCT: 16 % (ref 12–46)
Lymphs Abs: 0.6 10*3/uL — ABNORMAL LOW (ref 0.7–4.0)
MONO ABS: 0.5 10*3/uL (ref 0.1–1.0)
MONOS PCT: 13 % — AB (ref 3–12)
NEUTROS ABS: 2.6 10*3/uL (ref 1.7–7.7)
Neutrophils Relative %: 66 % (ref 43–77)

## 2013-09-21 LAB — MAGNESIUM: Magnesium: 2.7 mg/dL — ABNORMAL HIGH (ref 1.5–2.5)

## 2013-09-21 LAB — TRIGLYCERIDES: TRIGLYCERIDES: 86 mg/dL (ref <150)

## 2013-09-21 LAB — CBC
HEMATOCRIT: 25.1 % — AB (ref 39.0–52.0)
HEMOGLOBIN: 8 g/dL — AB (ref 13.0–17.0)
MCH: 28.9 pg (ref 26.0–34.0)
MCHC: 31.9 g/dL (ref 30.0–36.0)
MCV: 90.6 fL (ref 78.0–100.0)
Platelets: 142 10*3/uL — ABNORMAL LOW (ref 150–400)
RBC: 2.77 MIL/uL — AB (ref 4.22–5.81)
RDW: 14.7 % (ref 11.5–15.5)
WBC: 3.9 10*3/uL — AB (ref 4.0–10.5)

## 2013-09-21 LAB — PHOSPHORUS: Phosphorus: 2.3 mg/dL (ref 2.3–4.6)

## 2013-09-22 ENCOUNTER — Other Ambulatory Visit (HOSPITAL_COMMUNITY): Payer: Self-pay

## 2013-09-22 LAB — DIFFERENTIAL
Basophils Absolute: 0.1 10*3/uL (ref 0.0–0.1)
Basophils Relative: 1 % (ref 0–1)
EOS ABS: 0.1 10*3/uL (ref 0.0–0.7)
EOS PCT: 3 % (ref 0–5)
LYMPHS ABS: 0.7 10*3/uL (ref 0.7–4.0)
Lymphocytes Relative: 17 % (ref 12–46)
MONO ABS: 0.4 10*3/uL (ref 0.1–1.0)
Monocytes Relative: 9 % (ref 3–12)
Neutro Abs: 2.8 10*3/uL (ref 1.7–7.7)
Neutrophils Relative %: 70 % (ref 43–77)

## 2013-09-22 LAB — COMPREHENSIVE METABOLIC PANEL
ALK PHOS: 72 U/L (ref 39–117)
ALT: 11 U/L (ref 0–53)
ANION GAP: 16 — AB (ref 5–15)
AST: 10 U/L (ref 0–37)
Albumin: 2.2 g/dL — ABNORMAL LOW (ref 3.5–5.2)
BILIRUBIN TOTAL: 0.2 mg/dL — AB (ref 0.3–1.2)
BUN: 93 mg/dL — AB (ref 6–23)
CHLORIDE: 90 meq/L — AB (ref 96–112)
CO2: 24 mEq/L (ref 19–32)
CREATININE: 8.47 mg/dL — AB (ref 0.50–1.35)
Calcium: 11 mg/dL — ABNORMAL HIGH (ref 8.4–10.5)
GFR calc Af Amer: 7 mL/min — ABNORMAL LOW (ref >90)
GFR calc non Af Amer: 6 mL/min — ABNORMAL LOW (ref >90)
Glucose, Bld: 181 mg/dL — ABNORMAL HIGH (ref 70–99)
Potassium: 4.1 mEq/L (ref 3.7–5.3)
Sodium: 130 mEq/L — ABNORMAL LOW (ref 137–147)
Total Protein: 5.9 g/dL — ABNORMAL LOW (ref 6.0–8.3)

## 2013-09-22 LAB — PREALBUMIN: PREALBUMIN: 20.4 mg/dL (ref 17.0–34.0)

## 2013-09-22 LAB — CBC
HEMATOCRIT: 26.2 % — AB (ref 39.0–52.0)
Hemoglobin: 8.5 g/dL — ABNORMAL LOW (ref 13.0–17.0)
MCH: 29.1 pg (ref 26.0–34.0)
MCHC: 32.4 g/dL (ref 30.0–36.0)
MCV: 89.7 fL (ref 78.0–100.0)
PLATELETS: 148 10*3/uL — AB (ref 150–400)
RBC: 2.92 MIL/uL — ABNORMAL LOW (ref 4.22–5.81)
RDW: 14.6 % (ref 11.5–15.5)
WBC: 4.1 10*3/uL (ref 4.0–10.5)

## 2013-09-22 LAB — TRIGLYCERIDES: TRIGLYCERIDES: 66 mg/dL (ref <150)

## 2013-09-22 LAB — MAGNESIUM: Magnesium: 2.8 mg/dL — ABNORMAL HIGH (ref 1.5–2.5)

## 2013-09-22 LAB — PHOSPHORUS: Phosphorus: 2.4 mg/dL (ref 2.3–4.6)

## 2013-09-23 ENCOUNTER — Other Ambulatory Visit (HOSPITAL_COMMUNITY): Payer: Self-pay

## 2013-09-23 LAB — PHOSPHORUS: PHOSPHORUS: 2.4 mg/dL (ref 2.3–4.6)

## 2013-09-23 LAB — COMPREHENSIVE METABOLIC PANEL
ALT: 13 U/L (ref 0–53)
ANION GAP: 19 — AB (ref 5–15)
AST: 28 U/L (ref 0–37)
Albumin: 2.1 g/dL — ABNORMAL LOW (ref 3.5–5.2)
Alkaline Phosphatase: 76 U/L (ref 39–117)
BILIRUBIN TOTAL: 0.2 mg/dL — AB (ref 0.3–1.2)
BUN: 111 mg/dL — AB (ref 6–23)
CALCIUM: 11 mg/dL — AB (ref 8.4–10.5)
CO2: 19 meq/L (ref 19–32)
CREATININE: 9.68 mg/dL — AB (ref 0.50–1.35)
Chloride: 86 mEq/L — ABNORMAL LOW (ref 96–112)
GFR calc non Af Amer: 5 mL/min — ABNORMAL LOW (ref >90)
GFR, EST AFRICAN AMERICAN: 6 mL/min — AB (ref >90)
GLUCOSE: 294 mg/dL — AB (ref 70–99)
Potassium: 5.3 mEq/L (ref 3.7–5.3)
Sodium: 124 mEq/L — ABNORMAL LOW (ref 137–147)
Total Protein: 5.8 g/dL — ABNORMAL LOW (ref 6.0–8.3)

## 2013-09-23 LAB — CBC WITH DIFFERENTIAL/PLATELET
BASOS ABS: 0 10*3/uL (ref 0.0–0.1)
Basophils Relative: 0 % (ref 0–1)
EOS PCT: 4 % (ref 0–5)
Eosinophils Absolute: 0.2 10*3/uL (ref 0.0–0.7)
HCT: 24.7 % — ABNORMAL LOW (ref 39.0–52.0)
Hemoglobin: 8.1 g/dL — ABNORMAL LOW (ref 13.0–17.0)
Lymphocytes Relative: 13 % (ref 12–46)
Lymphs Abs: 0.5 10*3/uL — ABNORMAL LOW (ref 0.7–4.0)
MCH: 29.1 pg (ref 26.0–34.0)
MCHC: 32.8 g/dL (ref 30.0–36.0)
MCV: 88.8 fL (ref 78.0–100.0)
Monocytes Absolute: 0.3 10*3/uL (ref 0.1–1.0)
Monocytes Relative: 7 % (ref 3–12)
Neutro Abs: 2.9 10*3/uL (ref 1.7–7.7)
Neutrophils Relative %: 76 % (ref 43–77)
PLATELETS: 154 10*3/uL (ref 150–400)
RBC: 2.78 MIL/uL — ABNORMAL LOW (ref 4.22–5.81)
RDW: 14.5 % (ref 11.5–15.5)
WBC: 3.9 10*3/uL — ABNORMAL LOW (ref 4.0–10.5)

## 2013-09-23 LAB — MAGNESIUM: MAGNESIUM: 3.2 mg/dL — AB (ref 1.5–2.5)

## 2013-09-23 LAB — PREALBUMIN: Prealbumin: 23.6 mg/dL (ref 17.0–34.0)

## 2013-09-23 LAB — TRIGLYCERIDES: Triglycerides: 89 mg/dL (ref <150)

## 2013-09-24 ENCOUNTER — Encounter: Payer: Self-pay | Admitting: Physician Assistant

## 2013-09-24 DIAGNOSIS — R933 Abnormal findings on diagnostic imaging of other parts of digestive tract: Secondary | ICD-10-CM

## 2013-09-24 LAB — DIFFERENTIAL
BASOS PCT: 1 % (ref 0–1)
Basophils Absolute: 0 10*3/uL (ref 0.0–0.1)
Eosinophils Absolute: 0.1 10*3/uL (ref 0.0–0.7)
Eosinophils Relative: 3 % (ref 0–5)
LYMPHS ABS: 0.5 10*3/uL — AB (ref 0.7–4.0)
Lymphocytes Relative: 12 % (ref 12–46)
MONOS PCT: 9 % (ref 3–12)
Monocytes Absolute: 0.4 10*3/uL (ref 0.1–1.0)
NEUTROS ABS: 2.9 10*3/uL (ref 1.7–7.7)
Neutrophils Relative %: 75 % (ref 43–77)

## 2013-09-24 LAB — COMPREHENSIVE METABOLIC PANEL
ALBUMIN: 2.2 g/dL — AB (ref 3.5–5.2)
ALT: 10 U/L (ref 0–53)
AST: 9 U/L (ref 0–37)
Alkaline Phosphatase: 85 U/L (ref 39–117)
Anion gap: 16 — ABNORMAL HIGH (ref 5–15)
BUN: 79 mg/dL — ABNORMAL HIGH (ref 6–23)
CALCIUM: 10.9 mg/dL — AB (ref 8.4–10.5)
CO2: 24 mEq/L (ref 19–32)
Chloride: 90 mEq/L — ABNORMAL LOW (ref 96–112)
Creatinine, Ser: 7.32 mg/dL — ABNORMAL HIGH (ref 0.50–1.35)
GFR calc Af Amer: 8 mL/min — ABNORMAL LOW (ref >90)
GFR calc non Af Amer: 7 mL/min — ABNORMAL LOW (ref >90)
Glucose, Bld: 290 mg/dL — ABNORMAL HIGH (ref 70–99)
Potassium: 4.1 mEq/L (ref 3.7–5.3)
SODIUM: 130 meq/L — AB (ref 137–147)
TOTAL PROTEIN: 5.7 g/dL — AB (ref 6.0–8.3)
Total Bilirubin: 0.3 mg/dL (ref 0.3–1.2)

## 2013-09-24 LAB — CBC
HCT: 24.4 % — ABNORMAL LOW (ref 39.0–52.0)
Hemoglobin: 7.9 g/dL — ABNORMAL LOW (ref 13.0–17.0)
MCH: 28.6 pg (ref 26.0–34.0)
MCHC: 32.4 g/dL (ref 30.0–36.0)
MCV: 88.4 fL (ref 78.0–100.0)
Platelets: 140 10*3/uL — ABNORMAL LOW (ref 150–400)
RBC: 2.76 MIL/uL — AB (ref 4.22–5.81)
RDW: 14.7 % (ref 11.5–15.5)
WBC: 3.8 10*3/uL — AB (ref 4.0–10.5)

## 2013-09-24 LAB — MAGNESIUM: MAGNESIUM: 2.8 mg/dL — AB (ref 1.5–2.5)

## 2013-09-24 LAB — TRIGLYCERIDES: Triglycerides: 62 mg/dL (ref <150)

## 2013-09-24 LAB — PREALBUMIN: Prealbumin: 26.6 mg/dL (ref 17.0–34.0)

## 2013-09-24 LAB — PHOSPHORUS: Phosphorus: 2.5 mg/dL (ref 2.3–4.6)

## 2013-09-24 NOTE — Consult Note (Signed)
[facility] Consult: 1:24 PM 09/24/2013  LOS: 13 days    Referring Provider: Dr Sharyon MedicusHijazi  Primary Care Physician:  Dyke MaesMATTINGLY,MICHAEL T, MDnis renal MD.  Dr Darcella [facility] Primary Gastroenterologist:  Dr. Karena AddisonMary Shearin at [facility]    Reason for Consultation:  Colonic ileus   HPI: Jeffrey Manning is a 68 y.o. male.  Hx ESRD, OSA, PVD, CVA, decubitus ulcers, colon polyps.   [facility] hospital admission 6/5 - 6/24 with sepsis from right heal ulcer.  In background of chronic constipation (one BM weekly.  Using Dulcolax, Mag Citrate prn at home), he developed colonic and small bowel ileus.  GI tried NGT, rectal tube and endoscopic rectal decompression, IV Reglan, enteric Erythromycin, Neostigmine.  All to no avail.  Arrived at [facility] 6/24 with NGT to suction. The rectal tube was out and he was having some stools periodically but abdomen remains markedly distended, tense and MD has ordered placement of flexiseal rectal catheter.  Electrolytes were not depleted.  Serial films and CT scan demonstrate ongoing ileus.  He is on TNA.  Miralax added.  Has never had issues with ileus before recently, just the stable, chronic constipation.  Dr Jarold MottoShearin's later note mentions consideration of surgical decompression with colostomy or IR placed cecostomy tube placement.   Pt is narcotic dependent for chronic back pain.  Oxycodone at home, no longer in use as inpt.  But is taking 1 to 2 mg Dilaudid at most 2 times daily at [facility].  Wife says pt had colon[facility] with [facility] x 2 [facility] about 5 years ago.  Second study done to complete removal of larg polyp.     Past Medical History  Diagnosis Date  . Hypertension   . Diabetes mellitus   . Dialysis patient   . Back pain   . Sleep  apnea     diagnosed approx 15 years,  has not used cpap machine in 4 years  . PONV (postoperative nausea and vomiting)   . Dysrhythmia     [facility]  . Renal failure     dialysis T, Th, Sa.  [address], Dr. Joline Maxcymattely  . Arthritis     BACK PAIN  . Angina     approx. 10 years ago.  2/11- ndenies chest pain now, "none for a while".  . Constipation   . Depression   . Anxiety   . Pneumonia   . Myocardial infarction     spouse denies that pt had heart attack    Past Surgical History  Procedure Laterality Date  . Eye surgery      laser surgery 2 year ago bilaterally cataracts  . Back surgery      2012  . Insertion of dialysis catheter  03/22/2012    Procedure: INSERTION OF DIALYSIS CATHETER;  Surgeon: Sherren Kernsharles E Fields, MD;  [facility] OR;  Service: Vascular;  Laterality: Left;  Inserted Dialysis catheter Left Internal Jugular  . Colonoscopy w/ biopsies and polypectomy      Hx:  of  . Av fistula placement    . Av fistula placement Right 05/02/2012    Procedure: ARTERIOVENOUS (AV) FISTULA CREATION;  Surgeon: Sherren Kerns, MD;  Location: [facility] OR;  Service: Vascular;  Laterality: Right;  . Av fistula placement Right 07/23/12    SUPERFICIALIZATION     Scheduled Meds: TPN Novolog and Levemir insulin Aranesp weekly Erythromycin base 250 mg QID Heparin subq Metoclopramide 10 mg IV q 6 hours Miralax 17 grams daily  Protonix 40 mg IV BID   PRN Meds: Dilaudid 2 to 2 mg po prn      Allergies as of 09-13-2013 - Review Complete 11/22/2012  Allergen Reaction Noted  . Iohexol Nausea And Vomiting 12/31/2010    Family History  Problem Relation Age of Onset  . Hypertension Mother   . Diabetes Mother   . Hypertension Father   . Diabetes Father   . Diabetes Sister   . Kidney disease Brother   . Diabetes Brother   . Diabetes Brother     History   Social History  . Marital Status: Married    Spouse Name: N/A    Number of Children: N/A  . Years of Education:  N/A   Occupational History  . Not on file.   Social History Main Topics  . Smoking status: Former Smoker -- 20 years    Types: Cigarettes    Quit date: 03/21/1980  . Smokeless tobacco: Former Neurosurgeon    Types: Snuff, Chew     Comment: stopped apprx 25 years ago  . Alcohol Use: No  . Drug Use: No  . Sexual Activity: Yes   Other Topics Concern  . Not on file   Social History Narrative  . No narrative on file    REVIEW OF SYSTEMS: Constitutional:  Walks with cane at home.  Not bedridden ENT:  No nose bleeds Pulm:  No dyspnea at rest.  No cough CV:  No palpitations, no LE edema.  GU:  anuric GI:  Per HPI.  Occasional hiccups.  Heme:  Chronic anemia on chronic Aranesp   Transfusions:  2 PRBCs at [facility] in [facility]/2013. Neuro:  No headaches, no peripheral tingling or numbness Derm:  No itching, no rash or sores.  Endocrine:  No sweats or chills.  No polyuria or dysuria Immunization:  Not querried.  Travel:  None beyond local counties in last few months.    PHYSICAL EXAM: Vital signs :  BP 130/78 Pulse 80  resps 20 Temp 98 Sat 98% Weight 300#  General: Massively obese, chronically ill looking AAM Head:  No asymmetry, no trauma  Eyes:  No icterus or pallor Ears:  Not HOH  Nose:  No congestion.  NGT in place Mouth:  Moist, clear Neck:  No mass, no JVD, no TMG Lungs:  Clear bil in front Heart: RRR.  No mrg Abdomen:  Markedly protuberant/distended.  Tinkling distant BS.  Tympanitic.  Not tender. .   Rectal: deferred   Musc/Skeltl: no joint contracutures Extremities:  + LE/pedal edema  Neurologic:  Not oriented to place, date, confused about situation.  Follows commands and is alert.  Skin:  Dry, peeling in LE.  Bandage on R heal.  Onychomycosis.   Tattoos:  none Nodes:  No cervical adenopathy.   Psych:  Cooperative, relaxed.   LAB RESULTS:  Recent Labs  09/22/13 0627 09/23/13 0735 09/24/13 0935  WBC 4.1 3.9* 3.8*  HGB 8.5* 8.1* 7.9*  HCT 26.2* 24.7* 24.4*   PLT 148* 154  140*   BMET Lab Results  Component Value Date   NA 130* 09/24/2013   NA 124* 09/23/2013   NA 130* 09/22/2013   K 4.1 09/24/2013   K 5.3 09/23/2013   K 4.1 09/22/2013   CL 90* 09/24/2013   CL 86* 09/23/2013   CL 90* 09/22/2013   CO2 24 09/24/2013   CO2 19 09/23/2013   CO2 24 09/22/2013   GLUCOSE 290* 09/24/2013   GLUCOSE 294* 09/23/2013   GLUCOSE 181* 09/22/2013   BUN 79* 09/24/2013   BUN 111* 09/23/2013   BUN 93* 09/22/2013   CREATININE 7.32* 09/24/2013   CREATININE 9.68* 09/23/2013   CREATININE 8.47* 09/22/2013   CALCIUM 10.9* 09/24/2013   CALCIUM 11.0* 09/23/2013   CALCIUM 11.0* 09/22/2013   LFT  Recent Labs  09/22/13 0627 09/23/13 0438 09/24/13 0935  PROT 5.9* 5.8* 5.7*  ALBUMIN 2.2* 2.1* 2.2*  AST 10 28 9   ALT 11 13 10   ALKPHOS 72 76 85  BILITOT 0.2* 0.2* 0.3   PT/INR Lab Results  Component Value Date   INR 1.20 09/12/2013   INR 1.15 01/14/2011   RADIOLOGY STUDIES: PORTABLE ABDOMEN - 1 VIEW 09/22/2013 FINDINGS:  Again demonstrated are multiple loops of distended colon throughout  the abdomen. The maximal measured diameter of a loop of presumed  descending colon is 10.4 cm. The small bowel does not appear  distended. There is no gas in the rectum. No free extraluminal gas  collections are demonstrated. There is an esophagogastric tube in  place whose tip lies in the region of the distal gastric body.  IMPRESSION:  There has not been significant improvement in the appearance of the  colonic ileus or distal obstructive process. There is no evidence of  perforation today. Repeat CT scanning may be useful.   Ct Abdomen Pelvis Wo Contrast 09/23/2013   COMPARISON:  Abdominal radiograph -09/22/2013 semi CT the abdomen pelvis - 09/16/2013; 09/03/2013 ; 02/13/2011  FINDINGS: The lack of intravenous contrast limits the ability to evaluate solid abdominal organs.  Ingested enteric contrast is seen extending to the level of the hepatic flexure of the colon. There is persistent moderate to marked  gaseous distension, primarily involving the cecum, ascending and transverse colon with the cecum measuring approximately 10.5 cm in diameter (image 56, series 201), previously, 8.5 cm, and the ascending colon measuring approximately 9.5 cm in greatest maximal coronal diameter (image 21, series 204) previously, 9.9 cm. There is no gaseous distention of the upstream small bowel to suggest obstruction. The appendix is not visualized, however there is no inflammatory change within right lower abdominal quadrant. No pneumoperitoneum, pneumatosis or portal venous gas. Enteric tube terminates within the gastric antrum.  Normal hepatic contour. Multiple layering gallstones are seen within a decompressed gallbladder. Otherwise normal noncontrast appearance of the gallbladder. No ascites.  Unchanged atrophic appearance of the bilateral kidneys compatible with provided history of end-stage renal disease. Multiple bilateral hypo attenuating lesions are seen within the bilateral kidneys with the dominant right-sided renal lesion measuring approximately 1.6 cm in diameter and 10 Hounsfield units (image 29, series 202), incompletely evaluated on this noncontrast examination though likely representative of a renal cyst. The dominant approximately 1.5 cm partially exophytic lesion arising from the superior pole the left kidney (image 20, series 21) is noted to measure 33 Hounsfield units (image 28, series 21), again, incompletely evaluated without intravenous contrast though possibly representative of a minimally complex renal cyst. Vascular calcifications are noted about the bilateral renal hila. No discrete  renal stones. There is a minimal amount of likely age and body habitus related perinephric stranding. No urinary obstruction.  Normal noncontrast appearance of the bilateral adrenal glands and pancreas. Calcifications within otherwise normal-appearing spleen.  Moderate amount of calcified atherosclerotic plaque within a mildly  tortuous but normal caliber abdominal aorta. Scattered shotty retroperitoneal lymph nodes are individually not enlarged by size criteria. No definite retroperitoneal, mesenteric, pelvic or inguinal lymphadenopathy on this noncontrast examination.  Normal noncontrast appearance of the pelvic organs. No free fluid within the pelvis.  Limited visualization of the lower thorax demonstrates a residual trace right-sided pleural effusion. Minimal dependent subpleural ground-glass atelectasis. No discrete focal airspace opacities.  Normal heart size. Coronary artery calcifications. No pericardial effusion. There is diffuse decreased attenuation of the intra cardiac blood pool suggestive of anemia. Dialysis catheter tips terminating within the superior aspect of the right atrium.  No acute or aggressive osseus abnormalities. Stable sequela of L3-L4 paraspinal fusion with associated height loss and deformity primarily involving the L4 vertebral body with complete loss of the L3-L4 intervertebral disc space height. No definite evidence of hardware failure or loosening. 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  There is mild body wall edema about the bilateral flanks. Regional soft tissues appear otherwise normal.  IMPRESSION: 1. No change to slight worsening of findings most suggestive of persistent adynamic colonic ileus. No evidence of obstruction. 2. Atrophic appearance of the bilateral kidneys compatible with provided history of end-stage renal disease. 3. Multiple bilateral hypo attenuating renal lesions, incompletely evaluated on the present examination. Further evaluation could be performed with nonemergent renal ultrasound as clinically indicated. 4. 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   Electronically Signed   By: Simonne ComeJohn  Watts M.D.   On: 09/23/2013 13:08   [place] Renal 09/24/2013     FINDINGS: Right Kidney:  Length: 11.3 cm. Diffuse parenchymal atrophy with heterogeneous increased parenchymal echotexture consistent with medical renal disease. A a small a cystic appearing structure is demonstrated in the upper pole measuring about 1.6 cm maximal diameter. This corresponds to the lesion on CT. An additional midpole lesion measuring 2 cm diameter is identified. Penetration of these lesions is limited due to the increased parenchymal echotexture with these appear to represent cysts. There is a amorphous poorly defined area measured by the technologist in the right suprarenal region measuring about 5 cm diameter. No abnormality is identified in the CT scan to correspond to this lesion and is likely represents artifact.  Left Kidney:  Length: 8.3 cm. Heterogeneous increased parenchymal echotexture diffusely with diffuse parenchymal atrophy consistent with medical renal disease. Small low-attenuation lesion in the upper pole measuring 12 mm diameter, corresponding to the lesion seen at CT. Characterization is difficult due to echogenic appearance of the renal parenchyma but this appears to represent a cyst.  Bladder:  Bladder is non visualized. No urine output due to dialysis dependent renal failure.  IMPRESSION: Bilateral renal atrophy with increased echotexture consistent with chronic medical renal disease. Low-attenuation lesions in the kidneys corresponding to lesion seen on the prior CT consistent with cyst.   Electronically Signed   By: Burman NievesWilliam  Stevens M.D.   On: 09/24/2013 06:37    ENDOSCOPIC STUDIES: Colon[facility]op[facility] x 2 [facility] years ago.   6/22 Flex sig with placement decompresion tube [facility]arin [facility]  IMPRESSION:   *  Persistent colonic ileus.  Refractory to medical mgt. Despite tubes, meds as per HPI.  Background of chronic  constipation.   *  Right heal ulcer  *  ESRD  *  Anemia of renal failure.  Weekly Aranesp. Transfused 2 units blood at [facility]   *  Chronic back pain,  Previous spinal fusion.  Oxycodone at home, Dilaudid inpt.     PLAN:     *  ? Stop Dilaudid?   Doubt stopping it would turn things around and he has significant pain issues so will leave in place for now.  *  Rectal flexiseal catheter to be placed.  This was of no benefit at previous hospital but can retrial this.    Jennye Moccasin  09/24/2013, 1:24 PM Pager: [phone]    ________________________________________________________________________  Brooke Dare MD note:  I personally examined the patient, reviewed the data and agree with the assessment and plan described above.  I am meeting him for the first time today.  In [place] he was seen for 1-2 weeks by GI and general surgery for non-resolving colonic ileus.  Sigmoidoscopy with decompression tube was placed, NG tube was placed, neostigmine was tried, enteric erythromycin was tried, IV reglan was tried.  None was successful. Their last notes, prior to [facility] to [facility], was to transfer him to tertiary medical center to consider colostomy or IR placement of cecal decompression tube.    Here, he is nearly bedbound due to morbid obesity, foot ulcer pain.  He is on narcotic pain meds.  Blood sugars are in 2-300 range on daily BMETs.  NG tube has been in place for 1-2 weeks. He receives nutrition from TPN.  Flexiseal placed recently.    Need to completely stop all narcotic pain medicines (NSAIDs, tylenol will not impact gut motility). He needs to ambulate, get up and out of bed as much as possible.  Will need quite a lot of assist with this, obviously.  Need very tight DM control (bowel works poorly in hyperglycemic state).  Should keep electrolytes (especially Na and K) within normal range by checking QD or QOD BMET.  Should continue NG tube to low intermittent suction.  Should continue reglan  10mg  IV q 6 hours.  In my own experience, I have never seen need for IR placement of cecal decompression tube or surgical colostomy for ileus (as was suggested by his [place] [facility] and Surgeon prior to transfer here).  Usually significant ambulation, limiting narcotics, keeping electrolytes in normal range suffices.  Will follow with you.   Rob Bunting, MD [facility] Pager [phone]

## 2013-09-25 ENCOUNTER — Other Ambulatory Visit (HOSPITAL_COMMUNITY): Payer: Self-pay

## 2013-09-25 LAB — CBC
HEMATOCRIT: 25 % — AB (ref 39.0–52.0)
Hemoglobin: 8.2 g/dL — ABNORMAL LOW (ref 13.0–17.0)
MCH: 29.4 pg (ref 26.0–34.0)
MCHC: 32.8 g/dL (ref 30.0–36.0)
MCV: 89.6 fL (ref 78.0–100.0)
Platelets: 145 10*3/uL — ABNORMAL LOW (ref 150–400)
RBC: 2.79 MIL/uL — AB (ref 4.22–5.81)
RDW: 14.4 % (ref 11.5–15.5)
WBC: 4 10*3/uL (ref 4.0–10.5)

## 2013-09-25 LAB — COMPREHENSIVE METABOLIC PANEL
ALBUMIN: 2.3 g/dL — AB (ref 3.5–5.2)
ALT: 10 U/L (ref 0–53)
AST: 11 U/L (ref 0–37)
Alkaline Phosphatase: 87 U/L (ref 39–117)
Anion gap: 18 — ABNORMAL HIGH (ref 5–15)
BUN: 109 mg/dL — ABNORMAL HIGH (ref 6–23)
CO2: 23 mEq/L (ref 19–32)
Calcium: 11.9 mg/dL — ABNORMAL HIGH (ref 8.4–10.5)
Chloride: 87 mEq/L — ABNORMAL LOW (ref 96–112)
Creatinine, Ser: 8.87 mg/dL — ABNORMAL HIGH (ref 0.50–1.35)
GFR calc non Af Amer: 5 mL/min — ABNORMAL LOW (ref >90)
GFR, EST AFRICAN AMERICAN: 6 mL/min — AB (ref >90)
GLUCOSE: 308 mg/dL — AB (ref 70–99)
Potassium: 4.4 mEq/L (ref 3.7–5.3)
Sodium: 128 mEq/L — ABNORMAL LOW (ref 137–147)
Total Bilirubin: 0.3 mg/dL (ref 0.3–1.2)
Total Protein: 5.9 g/dL — ABNORMAL LOW (ref 6.0–8.3)

## 2013-09-25 LAB — DIFFERENTIAL
BASOS PCT: 1 % (ref 0–1)
Basophils Absolute: 0 10*3/uL (ref 0.0–0.1)
EOS PCT: 3 % (ref 0–5)
Eosinophils Absolute: 0.1 10*3/uL (ref 0.0–0.7)
LYMPHS PCT: 8 % — AB (ref 12–46)
Lymphs Abs: 0.3 10*3/uL — ABNORMAL LOW (ref 0.7–4.0)
MONOS PCT: 11 % (ref 3–12)
Monocytes Absolute: 0.4 10*3/uL (ref 0.1–1.0)
NEUTROS ABS: 3.2 10*3/uL (ref 1.7–7.7)
Neutrophils Relative %: 77 % (ref 43–77)

## 2013-09-25 LAB — TRIGLYCERIDES: Triglycerides: 98 mg/dL (ref <150)

## 2013-09-25 LAB — MAGNESIUM: Magnesium: 3 mg/dL — ABNORMAL HIGH (ref 1.5–2.5)

## 2013-09-25 LAB — PREALBUMIN: Prealbumin: 27.3 mg/dL (ref 17.0–34.0)

## 2013-09-25 LAB — PHOSPHORUS: PHOSPHORUS: 3 mg/dL (ref 2.3–4.6)

## 2013-09-26 DIAGNOSIS — I517 Cardiomegaly: Secondary | ICD-10-CM

## 2013-09-26 LAB — MAGNESIUM: Magnesium: 2.6 mg/dL — ABNORMAL HIGH (ref 1.5–2.5)

## 2013-09-26 LAB — COMPREHENSIVE METABOLIC PANEL
ALT: 11 U/L (ref 0–53)
AST: 14 U/L (ref 0–37)
Albumin: 2.3 g/dL — ABNORMAL LOW (ref 3.5–5.2)
Alkaline Phosphatase: 92 U/L (ref 39–117)
Anion gap: 14 (ref 5–15)
BUN: 72 mg/dL — ABNORMAL HIGH (ref 6–23)
CO2: 24 meq/L (ref 19–32)
Calcium: 11.5 mg/dL — ABNORMAL HIGH (ref 8.4–10.5)
Chloride: 94 mEq/L — ABNORMAL LOW (ref 96–112)
Creatinine, Ser: 6.49 mg/dL — ABNORMAL HIGH (ref 0.50–1.35)
GFR, EST AFRICAN AMERICAN: 9 mL/min — AB (ref >90)
GFR, EST NON AFRICAN AMERICAN: 8 mL/min — AB (ref >90)
GLUCOSE: 223 mg/dL — AB (ref 70–99)
POTASSIUM: 3.9 meq/L (ref 3.7–5.3)
SODIUM: 132 meq/L — AB (ref 137–147)
Total Bilirubin: 0.2 mg/dL — ABNORMAL LOW (ref 0.3–1.2)
Total Protein: 5.7 g/dL — ABNORMAL LOW (ref 6.0–8.3)

## 2013-09-26 LAB — CBC WITH DIFFERENTIAL/PLATELET
BASOS PCT: 1 % (ref 0–1)
Basophils Absolute: 0 10*3/uL (ref 0.0–0.1)
EOS ABS: 0.1 10*3/uL (ref 0.0–0.7)
Eosinophils Relative: 3 % (ref 0–5)
HCT: 24.4 % — ABNORMAL LOW (ref 39.0–52.0)
Hemoglobin: 7.8 g/dL — ABNORMAL LOW (ref 13.0–17.0)
Lymphocytes Relative: 11 % — ABNORMAL LOW (ref 12–46)
Lymphs Abs: 0.5 10*3/uL — ABNORMAL LOW (ref 0.7–4.0)
MCH: 28.5 pg (ref 26.0–34.0)
MCHC: 32 g/dL (ref 30.0–36.0)
MCV: 89.1 fL (ref 78.0–100.0)
Monocytes Absolute: 0.6 10*3/uL (ref 0.1–1.0)
Monocytes Relative: 12 % (ref 3–12)
NEUTROS PCT: 73 % (ref 43–77)
Neutro Abs: 3.4 10*3/uL (ref 1.7–7.7)
PLATELETS: 143 10*3/uL — AB (ref 150–400)
RBC: 2.74 MIL/uL — ABNORMAL LOW (ref 4.22–5.81)
RDW: 14.7 % (ref 11.5–15.5)
WBC: 4.7 10*3/uL (ref 4.0–10.5)

## 2013-09-26 LAB — PHOSPHORUS: Phosphorus: 2.5 mg/dL (ref 2.3–4.6)

## 2013-09-26 LAB — PREALBUMIN: Prealbumin: 29 mg/dL (ref 17.0–34.0)

## 2013-09-26 LAB — TRIGLYCERIDES: Triglycerides: 99 mg/dL (ref <150)

## 2013-09-26 LAB — TSH: TSH: 0.504 u[IU]/mL (ref 0.350–4.500)

## 2013-09-26 NOTE — Progress Notes (Signed)
Daily Rounding Note  09/26/2013, 10:17 AM  LOS: 15 days   SUBJECTIVE:       No complaints of abdominal pain.  NGT and flexiseal in place NGT output 100 CC yesterday.  flexiseal output 400 cc yesterday.   No dilaudid in last 24 hours, did get Ultram this AM.  OBJECTIVE:         Vital signs in last 24 hours:     110/70  Pulse 67  resp 16  Sat 100%  Temp 98.4    General: somnolent, did arouse with voice, slept through exam.  Pickwickian.  Heart: RRR Chest: clear bil.  No labored breathing Abdomen: softer, not tender.  BS present. Medium to light brown stool in the flexiseal.  Extremities: + peripheral edema in legs Neuro/Psych:  Somnolent, nearly obtunded.   Intake/Output from previous day:    Intake/Output this shift:    Lab Results: CBG this AM 225  Recent Labs  09/24/13 0935 09/25/13 0735 09/26/13 0435  WBC 3.8* 4.0 4.7  HGB 7.9* 8.2* 7.8*  HCT 24.4* 25.0* 24.4*  PLT 140* 145* 143*   BMET  Recent Labs  09/24/13 0935 09/25/13 0735 09/26/13 0435  NA 130* 128* 132*  K 4.1 4.4 3.9  CL 90* 87* 94*  CO2 24 23 24   GLUCOSE 290* 308* 223*  BUN 79* 109* 72*  CREATININE 7.32* 8.87* 6.49*  CALCIUM 10.9* 11.9* 11.5*   LFT  Recent Labs  09/24/13 0935 09/25/13 0735 09/26/13 0435  PROT 5.7* 5.9* 5.7*  ALBUMIN 2.2* 2.3* 2.3*  AST 9 11 14   ALT 10 10 11   ALKPHOS 85 87 92  BILITOT 0.3 0.3 0.2*    Studies/Results: Dg Abd Portable 1v 09/25/2013    COMPARISON:  09/22/2013  FINDINGS: NG tube in the stomach.  Moderate diffuse colonic dilatation is similar to the prior study. Gas-filled small bowel loops also are mildly distended.  Lumbar fusion with pedicle screws at L4-5 unchanged.  IMPRESSION: Moderate ileus unchanged.   Electronically Signed   By: Marlan Palau M.D.   On: 09/25/2013 08:07    ASSESMENT:   * Persistent colonic ileus. Refractory to medical mgt. persitent despite tubes, meds as per HPI. On  Reglan, Erythromycin, Miralax.  Background of chronic constipation.  No change on yesterday's KUB * Right heal ulcer.  * ESRD  * BNP of 11,529 on 6/26.  CXR then with bil  ATX. Right sided CHF?  No Echos in Epic records, no mention CHF in records.   * Anemia of renal failure. Weekly Aranesp. Transfused 2 units blood at [facility].  Looks like he may need additional transfusion.   * Thrombocytopenia. Mild, recent onset.  * Chronic back pain, Previous spinal fusion. Oxycodone at home, Dilaudid inpt has not been used x 24 hours but still on prn list.    PLAN   *  May be useful to obtain echo to eval for right heart failure and treat if present. D/w Dr Sharyon Medicus.  *  Continue to refrain from narcotics.  Not clear if he is being goteen OOB but needs mobility even if it is just getting to chair.  *  Another issue that may need investigation is does he have OSA and be in need of CPAP to improve mental status and improve his overall wellbeing.  Any and all medical conditions can contribute to ileus.  *  Check BNP, TSH and U/A.    Jennye Moccasin  09/26/2013, 10:17 AM Pager: [phone]  ________________________________________________________________________  [facility] MD note:  I personally examined the patient, reviewed the data and agree with the assessment and plan described above. BNP  was very high 1-2 weeks ago, perhaps underlying significant CHF?  Continue to not give narcotic pain meds.  He needs to be up and around as much as possible.   Rob Buntinganiel Jacobs, MD [facility] Pager [phone]

## 2013-09-26 NOTE — Progress Notes (Deleted)
          Daily Rounding Note  09/26/2013, 2:01 PM  LOS: 15 days   SUBJECTIVE:       No complaints of abdominal pain.  NGT and flexiseal in place NGT output 100 CC yesterday.  flexiseal output 400 cc yesterday.   No dilaudid in last 24 hours, did get Ultram this AM.  OBJECTIVE:         Vital signs in last 24 hours:     110/70  Pulse 67  resp 16  Sat 100%  Temp 98.4    General: somnolent, did arouse with voice, slept through exam.  Pickwickian.  Heart: RRR Chest: clear bil.  No labored breathing Abdomen: softer, not tender.  BS present. Medium to light brown stool in the flexiseal.  Extremities: + peripheral edema in legs Neuro/Psych:  Somnolent, nearly obtunded.   Lab Results: CBG this AM 225  Recent Labs  09/24/13 0935 09/25/13 0735 09/26/13 0435  WBC 3.8* 4.0 4.7  HGB 7.9* 8.2* 7.8*  HCT 24.4* 25.0* 24.4*  PLT 140* 145* 143*   BMET  Recent Labs  09/24/13 0935 09/25/13 0735 09/26/13 0435  NA 130* 128* 132*  K 4.1 4.4 3.9  CL 90* 87* 94*  CO2 24 23 24   GLUCOSE 290* 308* 223*  BUN 79* 109* 72*  CREATININE 7.32* 8.87* 6.49*  CALCIUM 10.9* 11.9* 11.5*   LFT  Recent Labs  09/24/13 0935 09/25/13 0735 09/26/13 0435  PROT 5.7* 5.9* 5.7*  ALBUMIN 2.2* 2.3* 2.3*  AST 9 11 14   ALT 10 10 11   ALKPHOS 85 87 92  BILITOT 0.3 0.3 0.2*    Studies/Results: Dg Abd Portable 1v 09/25/2013    COMPARISON:  09/22/2013  FINDINGS: NG tube in the stomach.  Moderate diffuse colonic dilatation is similar to the prior study. Gas-filled small bowel loops also are mildly distended.  Lumbar fusion with pedicle screws at L4-5 unchanged.  IMPRESSION: Moderate ileus unchanged.   Electronically Signed   By: Marlan Palauharles  Clark M.D.   On: 09/25/2013 08:07    ASSESMENT:   * Persistent colonic ileus. Refractory to medical mgt. persitent despite tubes, meds as per HPI. On Reglan, Erythromycin, Miralax.  Background of chronic constipation.    No change on yesterday's KUB * Right heal ulcer.  * ESRD  * BNP of 11,529 on 6/26.  CXR then with bil  ATX. Right sided CHF?  No Echos in Epic records, no mention CHF in records.   * Anemia of renal failure. Weekly Aranesp. [facility].  Looks like he may need additional transfusion.   * Thrombocytopenia. Mild, recent onset.  * Chronic back pain, Previous spinal fusion. Oxycodone at home, Dilaudid inpt has not been used x 24 hours but still on prn list.    PLAN   *  May be useful to obtain echo to eval for right heart failure and treat if present. D/w Dr Sharyon MedicusHijazi.  *  Continue to refrain from narcotics.  Not clear if he is being goteen OOB but needs mobility even if it is just getting to chair.  *  Another issue that may need investigation is does he have OSA and be in need of CPAP to improve mental status and improve his overall wellbeing.  Any and all medical conditions can contribute to ileus.  *  Check BNP, TSH and U/A. Ordered    Jennye MoccasinSarah Drury Ardizzone  09/26/2013, 2:01 PM Pager: [phone]

## 2013-09-27 ENCOUNTER — Encounter (HOSPITAL_BASED_OUTPATIENT_CLINIC_OR_DEPARTMENT_OTHER): Payer: Medicare Other | Attending: General Surgery

## 2013-09-27 ENCOUNTER — Other Ambulatory Visit (HOSPITAL_COMMUNITY): Payer: Self-pay

## 2013-09-27 LAB — PREALBUMIN: PREALBUMIN: 29.6 mg/dL (ref 17.0–34.0)

## 2013-09-27 LAB — CBC
HCT: 24.3 % — ABNORMAL LOW (ref 39.0–52.0)
Hemoglobin: 7.9 g/dL — ABNORMAL LOW (ref 13.0–17.0)
MCH: 28.5 pg (ref 26.0–34.0)
MCHC: 32.5 g/dL (ref 30.0–36.0)
MCV: 87.7 fL (ref 78.0–100.0)
PLATELETS: 146 10*3/uL — AB (ref 150–400)
RBC: 2.77 MIL/uL — ABNORMAL LOW (ref 4.22–5.81)
RDW: 14.3 % (ref 11.5–15.5)
WBC: 4 10*3/uL (ref 4.0–10.5)

## 2013-09-27 LAB — COMPREHENSIVE METABOLIC PANEL
ALT: 13 U/L (ref 0–53)
AST: 10 U/L (ref 0–37)
Albumin: 2.1 g/dL — ABNORMAL LOW (ref 3.5–5.2)
Alkaline Phosphatase: 94 U/L (ref 39–117)
Anion gap: 17 — ABNORMAL HIGH (ref 5–15)
BUN: 105 mg/dL — ABNORMAL HIGH (ref 6–23)
CALCIUM: 12.4 mg/dL — AB (ref 8.4–10.5)
CO2: 22 meq/L (ref 19–32)
CREATININE: 8.51 mg/dL — AB (ref 0.50–1.35)
Chloride: 93 mEq/L — ABNORMAL LOW (ref 96–112)
GFR calc non Af Amer: 6 mL/min — ABNORMAL LOW (ref >90)
GFR, EST AFRICAN AMERICAN: 7 mL/min — AB (ref >90)
Glucose, Bld: 233 mg/dL — ABNORMAL HIGH (ref 70–99)
Potassium: 4.1 mEq/L (ref 3.7–5.3)
Sodium: 132 mEq/L — ABNORMAL LOW (ref 137–147)
Total Bilirubin: 0.2 mg/dL — ABNORMAL LOW (ref 0.3–1.2)
Total Protein: 5.6 g/dL — ABNORMAL LOW (ref 6.0–8.3)

## 2013-09-27 LAB — RENAL FUNCTION PANEL
ALBUMIN: 2.2 g/dL — AB (ref 3.5–5.2)
Anion gap: 19 — ABNORMAL HIGH (ref 5–15)
BUN: 103 mg/dL — ABNORMAL HIGH (ref 6–23)
CALCIUM: 12.5 mg/dL — AB (ref 8.4–10.5)
CO2: 21 mEq/L (ref 19–32)
Chloride: 91 mEq/L — ABNORMAL LOW (ref 96–112)
Creatinine, Ser: 8.52 mg/dL — ABNORMAL HIGH (ref 0.50–1.35)
GFR calc Af Amer: 7 mL/min — ABNORMAL LOW (ref >90)
GFR, EST NON AFRICAN AMERICAN: 6 mL/min — AB (ref >90)
Glucose, Bld: 237 mg/dL — ABNORMAL HIGH (ref 70–99)
Phosphorus: 3.7 mg/dL (ref 2.3–4.6)
Potassium: 4.1 mEq/L (ref 3.7–5.3)
Sodium: 131 mEq/L — ABNORMAL LOW (ref 137–147)

## 2013-09-27 LAB — PRO B NATRIURETIC PEPTIDE: PRO B NATRI PEPTIDE: 8600 pg/mL — AB (ref 0–125)

## 2013-09-27 LAB — DIFFERENTIAL
BASOS ABS: 0 10*3/uL (ref 0.0–0.1)
Basophils Relative: 1 % (ref 0–1)
EOS ABS: 0.1 10*3/uL (ref 0.0–0.7)
Eosinophils Relative: 4 % (ref 0–5)
Lymphocytes Relative: 13 % (ref 12–46)
Lymphs Abs: 0.5 10*3/uL — ABNORMAL LOW (ref 0.7–4.0)
Monocytes Absolute: 0.3 10*3/uL (ref 0.1–1.0)
Monocytes Relative: 7 % (ref 3–12)
NEUTROS PCT: 75 % (ref 43–77)
Neutro Abs: 3 10*3/uL (ref 1.7–7.7)

## 2013-09-27 LAB — PHOSPHORUS: Phosphorus: 3.9 mg/dL (ref 2.3–4.6)

## 2013-09-27 LAB — MAGNESIUM: MAGNESIUM: 2.7 mg/dL — AB (ref 1.5–2.5)

## 2013-09-27 LAB — TRIGLYCERIDES: Triglycerides: 75 mg/dL (ref <150)

## 2013-09-28 ENCOUNTER — Other Ambulatory Visit (HOSPITAL_COMMUNITY): Payer: Self-pay

## 2013-09-28 LAB — COMPREHENSIVE METABOLIC PANEL
ALK PHOS: 109 U/L (ref 39–117)
ALT: 18 U/L (ref 0–53)
ANION GAP: 17 — AB (ref 5–15)
AST: 14 U/L (ref 0–37)
Albumin: 2.3 g/dL — ABNORMAL LOW (ref 3.5–5.2)
BUN: 68 mg/dL — ABNORMAL HIGH (ref 6–23)
CALCIUM: 12.1 mg/dL — AB (ref 8.4–10.5)
CO2: 22 mEq/L (ref 19–32)
Chloride: 95 mEq/L — ABNORMAL LOW (ref 96–112)
Creatinine, Ser: 6.34 mg/dL — ABNORMAL HIGH (ref 0.50–1.35)
GFR calc non Af Amer: 8 mL/min — ABNORMAL LOW (ref >90)
GFR, EST AFRICAN AMERICAN: 9 mL/min — AB (ref >90)
GLUCOSE: 283 mg/dL — AB (ref 70–99)
POTASSIUM: 4.5 meq/L (ref 3.7–5.3)
Sodium: 134 mEq/L — ABNORMAL LOW (ref 137–147)
TOTAL PROTEIN: 5.9 g/dL — AB (ref 6.0–8.3)
Total Bilirubin: 0.3 mg/dL (ref 0.3–1.2)

## 2013-09-28 LAB — CBC WITH DIFFERENTIAL/PLATELET
BASOS PCT: 0 % (ref 0–1)
Basophils Absolute: 0 10*3/uL (ref 0.0–0.1)
Eosinophils Absolute: 0.1 10*3/uL (ref 0.0–0.7)
Eosinophils Relative: 3 % (ref 0–5)
HCT: 27.4 % — ABNORMAL LOW (ref 39.0–52.0)
HEMOGLOBIN: 8.8 g/dL — AB (ref 13.0–17.0)
Lymphocytes Relative: 12 % (ref 12–46)
Lymphs Abs: 0.4 10*3/uL — ABNORMAL LOW (ref 0.7–4.0)
MCH: 28.5 pg (ref 26.0–34.0)
MCHC: 32.1 g/dL (ref 30.0–36.0)
MCV: 88.7 fL (ref 78.0–100.0)
MONO ABS: 0.5 10*3/uL (ref 0.1–1.0)
MONOS PCT: 14 % — AB (ref 3–12)
NEUTROS PCT: 71 % (ref 43–77)
Neutro Abs: 2.4 10*3/uL (ref 1.7–7.7)
Platelets: 152 10*3/uL (ref 150–400)
RBC: 3.09 MIL/uL — ABNORMAL LOW (ref 4.22–5.81)
RDW: 14.6 % (ref 11.5–15.5)
WBC: 3.4 10*3/uL — ABNORMAL LOW (ref 4.0–10.5)

## 2013-09-28 LAB — PREALBUMIN: Prealbumin: 27.8 mg/dL (ref 17.0–34.0)

## 2013-09-28 LAB — MAGNESIUM: Magnesium: 2.4 mg/dL (ref 1.5–2.5)

## 2013-09-28 LAB — PHOSPHORUS: PHOSPHORUS: 2.9 mg/dL (ref 2.3–4.6)

## 2013-09-28 LAB — TRIGLYCERIDES: Triglycerides: 99 mg/dL (ref <150)

## 2013-09-29 ENCOUNTER — Other Ambulatory Visit (HOSPITAL_COMMUNITY): Payer: Self-pay

## 2013-09-29 LAB — COMPREHENSIVE METABOLIC PANEL
ALBUMIN: 2.2 g/dL — AB (ref 3.5–5.2)
ALT: 17 U/L (ref 0–53)
AST: 14 U/L (ref 0–37)
Alkaline Phosphatase: 89 U/L (ref 39–117)
Anion gap: 17 — ABNORMAL HIGH (ref 5–15)
BUN: 99 mg/dL — ABNORMAL HIGH (ref 6–23)
CALCIUM: 12.7 mg/dL — AB (ref 8.4–10.5)
CO2: 22 mEq/L (ref 19–32)
Chloride: 98 mEq/L (ref 96–112)
Creatinine, Ser: 8.92 mg/dL — ABNORMAL HIGH (ref 0.50–1.35)
GFR calc Af Amer: 6 mL/min — ABNORMAL LOW (ref >90)
GFR calc non Af Amer: 5 mL/min — ABNORMAL LOW (ref >90)
Glucose, Bld: 40 mg/dL — CL (ref 70–99)
POTASSIUM: 3.8 meq/L (ref 3.7–5.3)
SODIUM: 137 meq/L (ref 137–147)
TOTAL PROTEIN: 5.6 g/dL — AB (ref 6.0–8.3)
Total Bilirubin: 0.2 mg/dL — ABNORMAL LOW (ref 0.3–1.2)

## 2013-09-29 LAB — DIFFERENTIAL
BASOS PCT: 0 % (ref 0–1)
Basophils Absolute: 0 10*3/uL (ref 0.0–0.1)
Eosinophils Absolute: 0.1 10*3/uL (ref 0.0–0.7)
Eosinophils Relative: 4 % (ref 0–5)
Lymphocytes Relative: 13 % (ref 12–46)
Lymphs Abs: 0.4 10*3/uL — ABNORMAL LOW (ref 0.7–4.0)
MONO ABS: 0.3 10*3/uL (ref 0.1–1.0)
Monocytes Relative: 10 % (ref 3–12)
NEUTROS PCT: 73 % (ref 43–77)
Neutro Abs: 2.3 10*3/uL (ref 1.7–7.7)

## 2013-09-29 LAB — AMMONIA: Ammonia: 33 umol/L (ref 11–60)

## 2013-09-29 LAB — PHOSPHORUS: Phosphorus: 3.7 mg/dL (ref 2.3–4.6)

## 2013-09-29 LAB — PREALBUMIN: Prealbumin: 24.9 mg/dL (ref 17.0–34.0)

## 2013-09-29 LAB — TRIGLYCERIDES: TRIGLYCERIDES: 54 mg/dL (ref <150)

## 2013-09-29 LAB — MAGNESIUM: MAGNESIUM: 2.3 mg/dL (ref 1.5–2.5)

## 2013-09-30 ENCOUNTER — Other Ambulatory Visit (HOSPITAL_COMMUNITY): Payer: Self-pay

## 2013-09-30 LAB — DIFFERENTIAL
Basophils Absolute: 0 10*3/uL (ref 0.0–0.1)
Basophils Relative: 1 % (ref 0–1)
EOS PCT: 3 % (ref 0–5)
Eosinophils Absolute: 0.1 10*3/uL (ref 0.0–0.7)
LYMPHS ABS: 0.5 10*3/uL — AB (ref 0.7–4.0)
LYMPHS PCT: 14 % (ref 12–46)
Monocytes Absolute: 0.2 10*3/uL (ref 0.1–1.0)
Monocytes Relative: 6 % (ref 3–12)
Neutro Abs: 2.8 10*3/uL (ref 1.7–7.7)
Neutrophils Relative %: 76 % (ref 43–77)

## 2013-09-30 LAB — COMPREHENSIVE METABOLIC PANEL
ALK PHOS: 94 U/L (ref 39–117)
ALT: 19 U/L (ref 0–53)
AST: 19 U/L (ref 0–37)
Albumin: 2.3 g/dL — ABNORMAL LOW (ref 3.5–5.2)
Anion gap: 18 — ABNORMAL HIGH (ref 5–15)
BUN: 112 mg/dL — AB (ref 6–23)
CO2: 20 meq/L (ref 19–32)
Calcium: 12.9 mg/dL — ABNORMAL HIGH (ref 8.4–10.5)
Chloride: 94 mEq/L — ABNORMAL LOW (ref 96–112)
Creatinine, Ser: 9.72 mg/dL — ABNORMAL HIGH (ref 0.50–1.35)
GFR calc non Af Amer: 5 mL/min — ABNORMAL LOW (ref >90)
GFR, EST AFRICAN AMERICAN: 6 mL/min — AB (ref >90)
Glucose, Bld: 289 mg/dL — ABNORMAL HIGH (ref 70–99)
Potassium: 4.6 mEq/L (ref 3.7–5.3)
SODIUM: 132 meq/L — AB (ref 137–147)
Total Bilirubin: 0.3 mg/dL (ref 0.3–1.2)
Total Protein: 5.8 g/dL — ABNORMAL LOW (ref 6.0–8.3)

## 2013-09-30 LAB — TRIGLYCERIDES: Triglycerides: 149 mg/dL (ref <150)

## 2013-09-30 LAB — AMMONIA: Ammonia: 20 umol/L (ref 11–60)

## 2013-09-30 LAB — PREALBUMIN: Prealbumin: 25.4 mg/dL (ref 17.0–34.0)

## 2013-09-30 LAB — CBC
HCT: 24.5 % — ABNORMAL LOW (ref 39.0–52.0)
HEMOGLOBIN: 7.9 g/dL — AB (ref 13.0–17.0)
MCH: 28.6 pg (ref 26.0–34.0)
MCHC: 32.2 g/dL (ref 30.0–36.0)
MCV: 88.8 fL (ref 78.0–100.0)
Platelets: 162 10*3/uL (ref 150–400)
RBC: 2.76 MIL/uL — AB (ref 4.22–5.81)
RDW: 14.8 % (ref 11.5–15.5)
WBC: 3.6 10*3/uL — ABNORMAL LOW (ref 4.0–10.5)

## 2013-09-30 LAB — PHOSPHORUS: Phosphorus: 3.9 mg/dL (ref 2.3–4.6)

## 2013-09-30 LAB — MAGNESIUM: Magnesium: 2.5 mg/dL (ref 1.5–2.5)

## 2013-09-30 NOTE — Progress Notes (Deleted)
Pt is demented and has not been drinking prep in timely manner, was still drinking this as of late AM.    Will reschedule colon/WEGD for tomorrow and give more prep via NGT tonight.   Jeffrey MoccasinSarah Felicie Kocher.

## 2013-09-30 NOTE — Progress Notes (Signed)
Daily Rounding Note  09/30/2013, 10:42 AM  LOS: 19 days   SUBJECTIVE:       flexiseal recorded output 50 cc yesterday.  100 cc NGT output yesterday. Pt feels bellly is less distended, more comfortable. No pain.  KUB just finished up as I entered room.   Has been up to chair regularly in last few days.  Was up to chair earlier today.  Not ambulating yet.   OBJECTIVE:         Vital signs in last 24 hours:    Temp 97.4  Pulse 80 rsp 20  Sats 22%  100% on RA General: ill looking but confortable and more alert   Heart: RRR Chest: some ronchi present, slight dyspnea and frunting resps at rest.  Abdomen: soft, obese and less distended/protuberant than last week.  BS sporadically active.  Not tender.  Brown liquid stool in bag  Extremities: + peripheral edema.  Neuro/Psych:  Not giving me accurate history.  Knows self and place.    Lab Results:  Recent Labs  09/28/13 0539 09/30/13 0554  WBC 3.4* 3.6*  HGB 8.8* 7.9*  HCT 27.4* 24.5*  PLT 152 162   BMET  Recent Labs  09/28/13 0539 09/29/13 1200 09/30/13 0554  NA 134* 137 132*  K 4.5 3.8 4.6  CL 95* 98 94*  CO2 22 22 20   GLUCOSE 283* 40* 289*  BUN 68* 99* 112*  CREATININE 6.34* 8.92* 9.72*  CALCIUM 12.1* 12.7* 12.9*   LFT  Recent Labs  09/28/13 0539 09/29/13 1200 09/30/13 0554  PROT 5.9* 5.6* 5.8*  ALBUMIN 2.3* 2.2* 2.3*  AST 14 14 19   ALT 18 17 19   ALKPHOS 109 89 94  BILITOT 0.3 0.2* 0.3   TSH  0.504  09/26/13   Studies/Results: Dg Chest Port 1 View 09/29/2013   CLINICAL DATA:  Cough and congestion.  EXAM: PORTABLE CHEST - 1 VIEW  COMPARISON:  No prior.  FINDINGS: Dialysis catheter, right central line, NG tube in stable position. Cardiomegaly with pulmonary vascular prominence and interstitial prominence consistent with congestive heart failure. No acute bony abnormality identified.  IMPRESSION: 1. Line and tube positions stable. 2. Congestive heart  failure with pulmonary edema.   Electronically Signed   By: Maisie Fus  Register   On: 09/29/2013 16:18   Dg Abd Portable 1v 09/28/2013   CLINICAL DATA:  NG tube placement.  EXAM: PORTABLE ABDOMEN - 1 VIEW  COMPARISON:  None.  FINDINGS: NG tube is in place with the tip in the midbody of the stomach.  IMPRESSION: As above.   Electronically Signed   By: Drusilla Kanner M.D.   On: 09/28/2013 16:38   Dg Abd Portable 1v 09/28/2013   CLINICAL DATA:  Ileus  EXAM: PORTABLE ABDOMEN - 1 VIEW  COMPARISON:  09/27/2013  FINDINGS: The NG tube is been removed from the stomach. No dilated loops of large or small bowel. There is small a gas in the sigmoid colon. Posterior lumbar fusion noted.  IMPRESSION: No evidence of bowel obstruction.  NG tube is been removed.   Electronically Signed   By: Genevive Bi M.D.   On: 09/28/2013 15:20    2 DEcho  09/26/13: - Left ventricle: The cavity size was normal. Wall thickness was increased in a pattern of mild LVH. Systolic function was vigorous. The estimated ejection fraction was in the range of 65% to 70%. Wall motion was normal; there were no regional wall motion  abnormalities. - Left atrium: The atrium was moderately dilated. - Right atrium: The atrium was moderately dilated.    ASSESMENT:   * Persistent colonic ileus. Refractory to medical mgt. persitent despite tubes, meds as per HPI. On Reglan, Erythromycin, Miralax.  Background of chronic constipation.  Clinically improved and ileus resolved by imaging on 7/11.  Not clear why NGT replaced since then.  Repeat KUB pending * Right heal ulcer.  * ESRD  * CHF and pul edema, elevated BNP but unimpressive Echo.   * Anemia of renal failure. Weekly Aranesp. [facility]  * Thrombocytopenia. Mild, recent onset.  * Chronic back pain, Previous spinal fusion. Oxycodone at home, Dilaudid inpt has not been used x 24 hours but still on prn list    PLAN   *  If KUB remains stable will have NGT and  rectal tube pulled.  Waiting on KUB   Addendum 145 PM:  KUB once again without ileus so placed orders to remove NGT, flexiseal and start clears.  Leave on Erythromycin, Reglan, Miralax, protonix for now but could start to wean off E Mycin in next few days.  Will sign off   Jennye MoccasinSarah Gribbin  09/30/2013, 10:42 AM Pager: [phone]  GI Attending Note  I have personally taken an interval history, reviewed the chart, and examined the patient.  I agree with the extender's note, impression and recommendations. Abdomen is soft with active bowel sounds.  OK to begin clears  Barbette Hairobert D. Arlyce DiceKaplan, MD, [facility] [phone]

## 2013-10-01 LAB — CBC
HCT: 28 % — ABNORMAL LOW (ref 39.0–52.0)
Hemoglobin: 8.9 g/dL — ABNORMAL LOW (ref 13.0–17.0)
MCH: 28.3 pg (ref 26.0–34.0)
MCHC: 31.8 g/dL (ref 30.0–36.0)
MCV: 89.2 fL (ref 78.0–100.0)
Platelets: 170 10*3/uL (ref 150–400)
RBC: 3.14 MIL/uL — ABNORMAL LOW (ref 4.22–5.81)
RDW: 14.6 % (ref 11.5–15.5)
WBC: 4.4 10*3/uL (ref 4.0–10.5)

## 2013-10-01 LAB — MAGNESIUM: Magnesium: 2.1 mg/dL (ref 1.5–2.5)

## 2013-10-01 LAB — COMPREHENSIVE METABOLIC PANEL
ALT: 22 U/L (ref 0–53)
ANION GAP: 17 — AB (ref 5–15)
AST: 16 U/L (ref 0–37)
Albumin: 2.5 g/dL — ABNORMAL LOW (ref 3.5–5.2)
Alkaline Phosphatase: 105 U/L (ref 39–117)
BILIRUBIN TOTAL: 0.3 mg/dL (ref 0.3–1.2)
BUN: 65 mg/dL — ABNORMAL HIGH (ref 6–23)
CHLORIDE: 94 meq/L — AB (ref 96–112)
CO2: 23 meq/L (ref 19–32)
CREATININE: 6.53 mg/dL — AB (ref 0.50–1.35)
Calcium: 11.3 mg/dL — ABNORMAL HIGH (ref 8.4–10.5)
GFR, EST AFRICAN AMERICAN: 9 mL/min — AB (ref >90)
GFR, EST NON AFRICAN AMERICAN: 8 mL/min — AB (ref >90)
GLUCOSE: 291 mg/dL — AB (ref 70–99)
Potassium: 3.6 mEq/L — ABNORMAL LOW (ref 3.7–5.3)
Sodium: 134 mEq/L — ABNORMAL LOW (ref 137–147)
Total Protein: 6.4 g/dL (ref 6.0–8.3)

## 2013-10-01 LAB — BLOOD GAS, ARTERIAL
ACID-BASE DEFICIT: 0.5 mmol/L (ref 0.0–2.0)
Bicarbonate: 23 mEq/L (ref 20.0–24.0)
FIO2: 0.21 %
O2 Saturation: 99.2 %
PCO2 ART: 33.4 mmHg — AB (ref 35.0–45.0)
Patient temperature: 98.5
TCO2: 24 mmol/L (ref 0–100)
pH, Arterial: 7.452 — ABNORMAL HIGH (ref 7.350–7.450)
pO2, Arterial: 117 mmHg — ABNORMAL HIGH (ref 80.0–100.0)

## 2013-10-01 LAB — DIFFERENTIAL
BASOS PCT: 1 % (ref 0–1)
Basophils Absolute: 0 10*3/uL (ref 0.0–0.1)
EOS ABS: 0.1 10*3/uL (ref 0.0–0.7)
EOS PCT: 3 % (ref 0–5)
Lymphocytes Relative: 16 % (ref 12–46)
Lymphs Abs: 0.7 10*3/uL (ref 0.7–4.0)
MONO ABS: 0.3 10*3/uL (ref 0.1–1.0)
MONOS PCT: 7 % (ref 3–12)
Neutro Abs: 3.3 10*3/uL (ref 1.7–7.7)
Neutrophils Relative %: 73 % (ref 43–77)

## 2013-10-01 LAB — PHOSPHORUS: Phosphorus: 2.5 mg/dL (ref 2.3–4.6)

## 2013-10-01 LAB — PREALBUMIN: PREALBUMIN: 26.3 mg/dL (ref 17.0–34.0)

## 2013-10-01 LAB — PRO B NATRIURETIC PEPTIDE: Pro B Natriuretic peptide (BNP): 9131 pg/mL — ABNORMAL HIGH (ref 0–125)

## 2013-10-01 LAB — TRIGLYCERIDES: TRIGLYCERIDES: 86 mg/dL (ref <150)

## 2013-10-02 ENCOUNTER — Other Ambulatory Visit (HOSPITAL_COMMUNITY): Payer: Self-pay

## 2013-10-02 LAB — COMPREHENSIVE METABOLIC PANEL
ALBUMIN: 2.2 g/dL — AB (ref 3.5–5.2)
ALT: 20 U/L (ref 0–53)
AST: 17 U/L (ref 0–37)
Alkaline Phosphatase: 89 U/L (ref 39–117)
Anion gap: 19 — ABNORMAL HIGH (ref 5–15)
BUN: 88 mg/dL — ABNORMAL HIGH (ref 6–23)
CO2: 21 mEq/L (ref 19–32)
Calcium: 12.1 mg/dL — ABNORMAL HIGH (ref 8.4–10.5)
Chloride: 91 mEq/L — ABNORMAL LOW (ref 96–112)
Creatinine, Ser: 7.87 mg/dL — ABNORMAL HIGH (ref 0.50–1.35)
GFR calc Af Amer: 7 mL/min — ABNORMAL LOW (ref >90)
GFR, EST NON AFRICAN AMERICAN: 6 mL/min — AB (ref >90)
GLUCOSE: 368 mg/dL — AB (ref 70–99)
POTASSIUM: 4 meq/L (ref 3.7–5.3)
Sodium: 131 mEq/L — ABNORMAL LOW (ref 137–147)
Total Bilirubin: 0.3 mg/dL (ref 0.3–1.2)
Total Protein: 5.8 g/dL — ABNORMAL LOW (ref 6.0–8.3)

## 2013-10-02 LAB — DIFFERENTIAL
BASOS ABS: 0 10*3/uL (ref 0.0–0.1)
BASOS PCT: 1 % (ref 0–1)
Eosinophils Absolute: 0.1 10*3/uL (ref 0.0–0.7)
Eosinophils Relative: 3 % (ref 0–5)
Lymphocytes Relative: 20 % (ref 12–46)
Lymphs Abs: 0.7 10*3/uL (ref 0.7–4.0)
MONO ABS: 0.3 10*3/uL (ref 0.1–1.0)
Monocytes Relative: 9 % (ref 3–12)
NEUTROS ABS: 2.5 10*3/uL (ref 1.7–7.7)
Neutrophils Relative %: 69 % (ref 43–77)

## 2013-10-02 LAB — CBC
HCT: 23.3 % — ABNORMAL LOW (ref 39.0–52.0)
Hemoglobin: 7.4 g/dL — ABNORMAL LOW (ref 13.0–17.0)
MCH: 27.8 pg (ref 26.0–34.0)
MCHC: 31.8 g/dL (ref 30.0–36.0)
MCV: 87.6 fL (ref 78.0–100.0)
Platelets: 175 10*3/uL (ref 150–400)
RBC: 2.66 MIL/uL — ABNORMAL LOW (ref 4.22–5.81)
RDW: 14.5 % (ref 11.5–15.5)
WBC: 3.7 10*3/uL — AB (ref 4.0–10.5)

## 2013-10-02 LAB — PHOSPHORUS: Phosphorus: 3.7 mg/dL (ref 2.3–4.6)

## 2013-10-02 LAB — VANCOMYCIN, TROUGH: VANCOMYCIN TR: 10.2 ug/mL (ref 10.0–20.0)

## 2013-10-02 LAB — TRIGLYCERIDES: Triglycerides: 68 mg/dL (ref <150)

## 2013-10-02 LAB — PREALBUMIN: Prealbumin: 25 mg/dL (ref 17.0–34.0)

## 2013-10-02 LAB — MAGNESIUM: MAGNESIUM: 2 mg/dL (ref 1.5–2.5)

## 2013-10-03 LAB — COMPREHENSIVE METABOLIC PANEL
ALBUMIN: 2.2 g/dL — AB (ref 3.5–5.2)
ALK PHOS: 87 U/L (ref 39–117)
ALT: 19 U/L (ref 0–53)
AST: 22 U/L (ref 0–37)
Anion gap: 16 — ABNORMAL HIGH (ref 5–15)
BILIRUBIN TOTAL: 0.3 mg/dL (ref 0.3–1.2)
BUN: 68 mg/dL — ABNORMAL HIGH (ref 6–23)
CHLORIDE: 97 meq/L (ref 96–112)
CO2: 23 meq/L (ref 19–32)
Calcium: 11.7 mg/dL — ABNORMAL HIGH (ref 8.4–10.5)
Creatinine, Ser: 6.63 mg/dL — ABNORMAL HIGH (ref 0.50–1.35)
GFR calc Af Amer: 9 mL/min — ABNORMAL LOW (ref >90)
GFR, EST NON AFRICAN AMERICAN: 8 mL/min — AB (ref >90)
Glucose, Bld: 179 mg/dL — ABNORMAL HIGH (ref 70–99)
POTASSIUM: 4.1 meq/L (ref 3.7–5.3)
Sodium: 136 mEq/L — ABNORMAL LOW (ref 137–147)
Total Protein: 5.8 g/dL — ABNORMAL LOW (ref 6.0–8.3)

## 2013-10-03 LAB — CBC WITH DIFFERENTIAL/PLATELET
Basophils Absolute: 0 10*3/uL (ref 0.0–0.1)
Basophils Relative: 1 % (ref 0–1)
Eosinophils Absolute: 0.1 10*3/uL (ref 0.0–0.7)
Eosinophils Relative: 3 % (ref 0–5)
HCT: 22.9 % — ABNORMAL LOW (ref 39.0–52.0)
HEMOGLOBIN: 7.3 g/dL — AB (ref 13.0–17.0)
LYMPHS ABS: 0.6 10*3/uL — AB (ref 0.7–4.0)
LYMPHS PCT: 19 % (ref 12–46)
MCH: 28 pg (ref 26.0–34.0)
MCHC: 31.9 g/dL (ref 30.0–36.0)
MCV: 87.7 fL (ref 78.0–100.0)
MONOS PCT: 9 % (ref 3–12)
Monocytes Absolute: 0.3 10*3/uL (ref 0.1–1.0)
NEUTROS ABS: 2.2 10*3/uL (ref 1.7–7.7)
NEUTROS PCT: 68 % (ref 43–77)
Platelets: 150 10*3/uL (ref 150–400)
RBC: 2.61 MIL/uL — AB (ref 4.22–5.81)
RDW: 14.6 % (ref 11.5–15.5)
WBC: 3.1 10*3/uL — ABNORMAL LOW (ref 4.0–10.5)

## 2013-10-03 LAB — PREALBUMIN: Prealbumin: 23.5 mg/dL (ref 17.0–34.0)

## 2013-10-03 LAB — PHOSPHORUS: Phosphorus: 3.4 mg/dL (ref 2.3–4.6)

## 2013-10-03 LAB — PROCALCITONIN: PROCALCITONIN: 6.13 ng/mL

## 2013-10-03 LAB — MAGNESIUM: Magnesium: 1.9 mg/dL (ref 1.5–2.5)

## 2013-10-03 LAB — TRIGLYCERIDES: Triglycerides: 122 mg/dL (ref <150)

## 2013-10-04 LAB — RENAL FUNCTION PANEL
Albumin: 2.1 g/dL — ABNORMAL LOW (ref 3.5–5.2)
Anion gap: 16 — ABNORMAL HIGH (ref 5–15)
BUN: 85 mg/dL — AB (ref 6–23)
CHLORIDE: 96 meq/L (ref 96–112)
CO2: 23 mEq/L (ref 19–32)
Calcium: 11.4 mg/dL — ABNORMAL HIGH (ref 8.4–10.5)
Creatinine, Ser: 8.01 mg/dL — ABNORMAL HIGH (ref 0.50–1.35)
GFR calc Af Amer: 7 mL/min — ABNORMAL LOW (ref >90)
GFR calc non Af Amer: 6 mL/min — ABNORMAL LOW (ref >90)
GLUCOSE: 215 mg/dL — AB (ref 70–99)
Phosphorus: 3.6 mg/dL (ref 2.3–4.6)
Potassium: 3.6 mEq/L — ABNORMAL LOW (ref 3.7–5.3)
Sodium: 135 mEq/L — ABNORMAL LOW (ref 137–147)

## 2013-10-04 LAB — COMPREHENSIVE METABOLIC PANEL
ALK PHOS: 74 U/L (ref 39–117)
ALT: 15 U/L (ref 0–53)
AST: 11 U/L (ref 0–37)
Albumin: 2.1 g/dL — ABNORMAL LOW (ref 3.5–5.2)
Anion gap: 16 — ABNORMAL HIGH (ref 5–15)
BILIRUBIN TOTAL: 0.2 mg/dL — AB (ref 0.3–1.2)
BUN: 84 mg/dL — AB (ref 6–23)
CHLORIDE: 96 meq/L (ref 96–112)
CO2: 22 meq/L (ref 19–32)
Calcium: 11.3 mg/dL — ABNORMAL HIGH (ref 8.4–10.5)
Creatinine, Ser: 7.86 mg/dL — ABNORMAL HIGH (ref 0.50–1.35)
GFR calc Af Amer: 7 mL/min — ABNORMAL LOW (ref >90)
GFR calc non Af Amer: 6 mL/min — ABNORMAL LOW (ref >90)
GLUCOSE: 212 mg/dL — AB (ref 70–99)
POTASSIUM: 3.5 meq/L — AB (ref 3.7–5.3)
Sodium: 134 mEq/L — ABNORMAL LOW (ref 137–147)
Total Protein: 5.4 g/dL — ABNORMAL LOW (ref 6.0–8.3)

## 2013-10-04 LAB — C-REACTIVE PROTEIN: CRP: 3.7 mg/dL — AB (ref <0.60)

## 2013-10-04 LAB — DIFFERENTIAL
Basophils Absolute: 0 10*3/uL (ref 0.0–0.1)
Basophils Relative: 0 % (ref 0–1)
Eosinophils Absolute: 0.1 10*3/uL (ref 0.0–0.7)
Eosinophils Relative: 3 % (ref 0–5)
LYMPHS ABS: 0.4 10*3/uL — AB (ref 0.7–4.0)
LYMPHS PCT: 16 % (ref 12–46)
Monocytes Absolute: 0.3 10*3/uL (ref 0.1–1.0)
Monocytes Relative: 13 % — ABNORMAL HIGH (ref 3–12)
Neutro Abs: 1.6 10*3/uL — ABNORMAL LOW (ref 1.7–7.7)
Neutrophils Relative %: 68 % (ref 43–77)

## 2013-10-04 LAB — MAGNESIUM: Magnesium: 1.8 mg/dL (ref 1.5–2.5)

## 2013-10-04 LAB — CBC
HEMATOCRIT: 26.8 % — AB (ref 39.0–52.0)
Hemoglobin: 8.6 g/dL — ABNORMAL LOW (ref 13.0–17.0)
MCH: 27.6 pg (ref 26.0–34.0)
MCHC: 32.1 g/dL (ref 30.0–36.0)
MCV: 85.9 fL (ref 78.0–100.0)
Platelets: 132 10*3/uL — ABNORMAL LOW (ref 150–400)
RBC: 3.12 MIL/uL — ABNORMAL LOW (ref 4.22–5.81)
RDW: 14.5 % (ref 11.5–15.5)
WBC: 2.4 10*3/uL — AB (ref 4.0–10.5)

## 2013-10-04 LAB — SEDIMENTATION RATE: Sed Rate: 65 mm/hr — ABNORMAL HIGH (ref 0–16)

## 2013-10-04 LAB — PREALBUMIN: Prealbumin: 23.1 mg/dL (ref 17.0–34.0)

## 2013-10-04 LAB — TRIGLYCERIDES: TRIGLYCERIDES: 39 mg/dL (ref <150)

## 2013-10-04 LAB — PHOSPHORUS: Phosphorus: 3.6 mg/dL (ref 2.3–4.6)

## 2013-10-05 LAB — CBC
HCT: 22.3 % — ABNORMAL LOW (ref 39.0–52.0)
HEMOGLOBIN: 6.9 g/dL — AB (ref 13.0–17.0)
MCH: 27.2 pg (ref 26.0–34.0)
MCHC: 30.9 g/dL (ref 30.0–36.0)
MCV: 87.8 fL (ref 78.0–100.0)
Platelets: 146 10*3/uL — ABNORMAL LOW (ref 150–400)
RBC: 2.54 MIL/uL — ABNORMAL LOW (ref 4.22–5.81)
RDW: 14.6 % (ref 11.5–15.5)
WBC: 3.2 10*3/uL — AB (ref 4.0–10.5)

## 2013-10-05 LAB — COMPREHENSIVE METABOLIC PANEL
ALT: 13 U/L (ref 0–53)
AST: 11 U/L (ref 0–37)
Albumin: 2.3 g/dL — ABNORMAL LOW (ref 3.5–5.2)
Alkaline Phosphatase: 81 U/L (ref 39–117)
Anion gap: 16 — ABNORMAL HIGH (ref 5–15)
BILIRUBIN TOTAL: 0.2 mg/dL — AB (ref 0.3–1.2)
BUN: 60 mg/dL — ABNORMAL HIGH (ref 6–23)
CHLORIDE: 95 meq/L — AB (ref 96–112)
CO2: 22 mEq/L (ref 19–32)
Calcium: 10.8 mg/dL — ABNORMAL HIGH (ref 8.4–10.5)
Creatinine, Ser: 5.64 mg/dL — ABNORMAL HIGH (ref 0.50–1.35)
GFR calc Af Amer: 11 mL/min — ABNORMAL LOW (ref >90)
GFR calc non Af Amer: 9 mL/min — ABNORMAL LOW (ref >90)
Glucose, Bld: 271 mg/dL — ABNORMAL HIGH (ref 70–99)
POTASSIUM: 3.4 meq/L — AB (ref 3.7–5.3)
Sodium: 133 mEq/L — ABNORMAL LOW (ref 137–147)
Total Protein: 5.5 g/dL — ABNORMAL LOW (ref 6.0–8.3)

## 2013-10-05 LAB — CBC WITH DIFFERENTIAL/PLATELET
Basophils Absolute: 0 10*3/uL (ref 0.0–0.1)
Basophils Relative: 1 % (ref 0–1)
EOS PCT: 2 % (ref 0–5)
Eosinophils Absolute: 0.1 10*3/uL (ref 0.0–0.7)
HCT: 21.4 % — ABNORMAL LOW (ref 39.0–52.0)
Hemoglobin: 6.8 g/dL — CL (ref 13.0–17.0)
LYMPHS ABS: 0.5 10*3/uL — AB (ref 0.7–4.0)
LYMPHS PCT: 19 % (ref 12–46)
MCH: 28 pg (ref 26.0–34.0)
MCHC: 31.8 g/dL (ref 30.0–36.0)
MCV: 88.1 fL (ref 78.0–100.0)
MONOS PCT: 8 % (ref 3–12)
Monocytes Absolute: 0.2 10*3/uL (ref 0.1–1.0)
Neutro Abs: 1.9 10*3/uL (ref 1.7–7.7)
Neutrophils Relative %: 70 % (ref 43–77)
PLATELETS: 136 10*3/uL — AB (ref 150–400)
RBC: 2.43 MIL/uL — AB (ref 4.22–5.81)
RDW: 14.7 % (ref 11.5–15.5)
WBC: 2.7 10*3/uL — AB (ref 4.0–10.5)

## 2013-10-05 LAB — BASIC METABOLIC PANEL
ANION GAP: 16 — AB (ref 5–15)
BUN: 62 mg/dL — ABNORMAL HIGH (ref 6–23)
CHLORIDE: 95 meq/L — AB (ref 96–112)
CO2: 22 meq/L (ref 19–32)
Calcium: 11 mg/dL — ABNORMAL HIGH (ref 8.4–10.5)
Creatinine, Ser: 5.84 mg/dL — ABNORMAL HIGH (ref 0.50–1.35)
GFR calc Af Amer: 10 mL/min — ABNORMAL LOW (ref >90)
GFR calc non Af Amer: 9 mL/min — ABNORMAL LOW (ref >90)
GLUCOSE: 205 mg/dL — AB (ref 70–99)
Potassium: 3.6 mEq/L — ABNORMAL LOW (ref 3.7–5.3)
SODIUM: 133 meq/L — AB (ref 137–147)

## 2013-10-05 LAB — PREALBUMIN: PREALBUMIN: 24.2 mg/dL (ref 17.0–34.0)

## 2013-10-05 LAB — PREPARE RBC (CROSSMATCH)

## 2013-10-05 LAB — MAGNESIUM: Magnesium: 1.7 mg/dL (ref 1.5–2.5)

## 2013-10-05 LAB — TRIGLYCERIDES: TRIGLYCERIDES: 69 mg/dL (ref <150)

## 2013-10-05 LAB — PHOSPHORUS: PHOSPHORUS: 2.6 mg/dL (ref 2.3–4.6)

## 2013-10-06 ENCOUNTER — Other Ambulatory Visit (HOSPITAL_COMMUNITY): Payer: Self-pay

## 2013-10-06 LAB — CBC WITH DIFFERENTIAL/PLATELET
BASOS PCT: 1 % (ref 0–1)
Basophils Absolute: 0 10*3/uL (ref 0.0–0.1)
Eosinophils Absolute: 0.1 10*3/uL (ref 0.0–0.7)
Eosinophils Relative: 3 % (ref 0–5)
HCT: 25.4 % — ABNORMAL LOW (ref 39.0–52.0)
Hemoglobin: 8.2 g/dL — ABNORMAL LOW (ref 13.0–17.0)
Lymphocytes Relative: 16 % (ref 12–46)
Lymphs Abs: 0.7 10*3/uL (ref 0.7–4.0)
MCH: 27.6 pg (ref 26.0–34.0)
MCHC: 32.3 g/dL (ref 30.0–36.0)
MCV: 85.5 fL (ref 78.0–100.0)
Monocytes Absolute: 0.3 10*3/uL (ref 0.1–1.0)
Monocytes Relative: 7 % (ref 3–12)
NEUTROS PCT: 73 % (ref 43–77)
Neutro Abs: 3.1 10*3/uL (ref 1.7–7.7)
PLATELETS: 159 10*3/uL (ref 150–400)
RBC: 2.97 MIL/uL — AB (ref 4.22–5.81)
RDW: 14.8 % (ref 11.5–15.5)
WBC: 4.2 10*3/uL (ref 4.0–10.5)

## 2013-10-06 LAB — CBC
HEMATOCRIT: 26.7 % — AB (ref 39.0–52.0)
HEMOGLOBIN: 8.8 g/dL — AB (ref 13.0–17.0)
MCH: 28.7 pg (ref 26.0–34.0)
MCHC: 33 g/dL (ref 30.0–36.0)
MCV: 87 fL (ref 78.0–100.0)
Platelets: 158 10*3/uL (ref 150–400)
RBC: 3.07 MIL/uL — ABNORMAL LOW (ref 4.22–5.81)
RDW: 14.5 % (ref 11.5–15.5)
WBC: 4 10*3/uL (ref 4.0–10.5)

## 2013-10-06 LAB — CULTURE, BLOOD (ROUTINE X 2): Culture: NO GROWTH

## 2013-10-06 LAB — PROCALCITONIN: PROCALCITONIN: 4.91 ng/mL

## 2013-10-06 LAB — COMPREHENSIVE METABOLIC PANEL
ALT: 11 U/L (ref 0–53)
AST: 11 U/L (ref 0–37)
Albumin: 2.2 g/dL — ABNORMAL LOW (ref 3.5–5.2)
Alkaline Phosphatase: 76 U/L (ref 39–117)
Anion gap: 16 — ABNORMAL HIGH (ref 5–15)
BUN: 81 mg/dL — ABNORMAL HIGH (ref 6–23)
CALCIUM: 11.4 mg/dL — AB (ref 8.4–10.5)
CO2: 22 meq/L (ref 19–32)
CREATININE: 6.94 mg/dL — AB (ref 0.50–1.35)
Chloride: 97 mEq/L (ref 96–112)
GFR, EST AFRICAN AMERICAN: 9 mL/min — AB (ref >90)
GFR, EST NON AFRICAN AMERICAN: 7 mL/min — AB (ref >90)
Glucose, Bld: 170 mg/dL — ABNORMAL HIGH (ref 70–99)
Potassium: 3.8 mEq/L (ref 3.7–5.3)
Sodium: 135 mEq/L — ABNORMAL LOW (ref 137–147)
Total Bilirubin: 0.4 mg/dL (ref 0.3–1.2)
Total Protein: 5.5 g/dL — ABNORMAL LOW (ref 6.0–8.3)

## 2013-10-06 LAB — MAGNESIUM: Magnesium: 1.6 mg/dL (ref 1.5–2.5)

## 2013-10-06 LAB — PHOSPHORUS: Phosphorus: 3 mg/dL (ref 2.3–4.6)

## 2013-10-06 LAB — PREALBUMIN: PREALBUMIN: 23.6 mg/dL (ref 17.0–34.0)

## 2013-10-07 ENCOUNTER — Other Ambulatory Visit (HOSPITAL_COMMUNITY): Payer: Self-pay

## 2013-10-07 LAB — CBC WITH DIFFERENTIAL/PLATELET
BASOS PCT: 1 % (ref 0–1)
Basophils Absolute: 0 10*3/uL (ref 0.0–0.1)
Eosinophils Absolute: 0.1 10*3/uL (ref 0.0–0.7)
Eosinophils Relative: 2 % (ref 0–5)
HCT: 24.7 % — ABNORMAL LOW (ref 39.0–52.0)
HEMOGLOBIN: 8 g/dL — AB (ref 13.0–17.0)
LYMPHS ABS: 0.6 10*3/uL — AB (ref 0.7–4.0)
Lymphocytes Relative: 14 % (ref 12–46)
MCH: 27.7 pg (ref 26.0–34.0)
MCHC: 32.4 g/dL (ref 30.0–36.0)
MCV: 85.5 fL (ref 78.0–100.0)
Monocytes Absolute: 0.2 10*3/uL (ref 0.1–1.0)
Monocytes Relative: 6 % (ref 3–12)
NEUTROS ABS: 3.2 10*3/uL (ref 1.7–7.7)
NEUTROS PCT: 77 % (ref 43–77)
Platelets: 156 10*3/uL (ref 150–400)
RBC: 2.89 MIL/uL — AB (ref 4.22–5.81)
RDW: 15 % (ref 11.5–15.5)
WBC: 4.1 10*3/uL (ref 4.0–10.5)

## 2013-10-07 LAB — COMPREHENSIVE METABOLIC PANEL
ALBUMIN: 2.2 g/dL — AB (ref 3.5–5.2)
ALT: 12 U/L (ref 0–53)
AST: 12 U/L (ref 0–37)
Alkaline Phosphatase: 81 U/L (ref 39–117)
Anion gap: 18 — ABNORMAL HIGH (ref 5–15)
BUN: 109 mg/dL — ABNORMAL HIGH (ref 6–23)
CALCIUM: 11.6 mg/dL — AB (ref 8.4–10.5)
CO2: 19 mEq/L (ref 19–32)
Chloride: 96 mEq/L (ref 96–112)
Creatinine, Ser: 8.78 mg/dL — ABNORMAL HIGH (ref 0.50–1.35)
GFR calc Af Amer: 6 mL/min — ABNORMAL LOW (ref >90)
GFR calc non Af Amer: 6 mL/min — ABNORMAL LOW (ref >90)
Glucose, Bld: 355 mg/dL — ABNORMAL HIGH (ref 70–99)
Potassium: 3.4 mEq/L — ABNORMAL LOW (ref 3.7–5.3)
SODIUM: 133 meq/L — AB (ref 137–147)
TOTAL PROTEIN: 5.7 g/dL — AB (ref 6.0–8.3)
Total Bilirubin: 0.3 mg/dL (ref 0.3–1.2)

## 2013-10-07 LAB — MAGNESIUM: Magnesium: 1.7 mg/dL (ref 1.5–2.5)

## 2013-10-07 LAB — PHOSPHORUS: PHOSPHORUS: 3.4 mg/dL (ref 2.3–4.6)

## 2013-10-07 LAB — SEDIMENTATION RATE: Sed Rate: 37 mm/hr — ABNORMAL HIGH (ref 0–16)

## 2013-10-07 LAB — TRIGLYCERIDES: TRIGLYCERIDES: 78 mg/dL (ref <150)

## 2013-10-08 LAB — CBC
HCT: 25.4 % — ABNORMAL LOW (ref 39.0–52.0)
Hemoglobin: 8.1 g/dL — ABNORMAL LOW (ref 13.0–17.0)
MCH: 27.5 pg (ref 26.0–34.0)
MCHC: 31.9 g/dL (ref 30.0–36.0)
MCV: 86.1 fL (ref 78.0–100.0)
PLATELETS: UNDETERMINED 10*3/uL (ref 150–400)
RBC: 2.95 MIL/uL — ABNORMAL LOW (ref 4.22–5.81)
RDW: 15 % (ref 11.5–15.5)
WBC: 4 10*3/uL (ref 4.0–10.5)

## 2013-10-08 LAB — DIFFERENTIAL
BASOS ABS: 0 10*3/uL (ref 0.0–0.1)
Basophils Relative: 0 % (ref 0–1)
Eosinophils Absolute: 0.1 10*3/uL (ref 0.0–0.7)
Eosinophils Relative: 2 % (ref 0–5)
Lymphocytes Relative: 14 % (ref 12–46)
Lymphs Abs: 0.6 10*3/uL — ABNORMAL LOW (ref 0.7–4.0)
MONOS PCT: 5 % (ref 3–12)
Monocytes Absolute: 0.2 10*3/uL (ref 0.1–1.0)
Neutro Abs: 3.1 10*3/uL (ref 1.7–7.7)
Neutrophils Relative %: 79 % — ABNORMAL HIGH (ref 43–77)

## 2013-10-08 LAB — MAGNESIUM: MAGNESIUM: 1.6 mg/dL (ref 1.5–2.5)

## 2013-10-08 LAB — COMPREHENSIVE METABOLIC PANEL
ALK PHOS: 81 U/L (ref 39–117)
ALT: 11 U/L (ref 0–53)
AST: 10 U/L (ref 0–37)
Albumin: 2.1 g/dL — ABNORMAL LOW (ref 3.5–5.2)
Anion gap: 17 — ABNORMAL HIGH (ref 5–15)
BILIRUBIN TOTAL: 0.4 mg/dL (ref 0.3–1.2)
BUN: 71 mg/dL — ABNORMAL HIGH (ref 6–23)
CHLORIDE: 96 meq/L (ref 96–112)
CO2: 23 meq/L (ref 19–32)
Calcium: 11 mg/dL — ABNORMAL HIGH (ref 8.4–10.5)
Creatinine, Ser: 6.31 mg/dL — ABNORMAL HIGH (ref 0.50–1.35)
GFR calc Af Amer: 10 mL/min — ABNORMAL LOW (ref >90)
GFR calc non Af Amer: 8 mL/min — ABNORMAL LOW (ref >90)
Glucose, Bld: 343 mg/dL — ABNORMAL HIGH (ref 70–99)
POTASSIUM: 3.2 meq/L — AB (ref 3.7–5.3)
SODIUM: 136 meq/L — AB (ref 137–147)
Total Protein: 5.7 g/dL — ABNORMAL LOW (ref 6.0–8.3)

## 2013-10-08 LAB — PREALBUMIN
PREALBUMIN: 22.5 mg/dL (ref 17.0–34.0)
Prealbumin: 21.3 mg/dL (ref 17.0–34.0)

## 2013-10-08 LAB — C-REACTIVE PROTEIN: CRP: 6.4 mg/dL — ABNORMAL HIGH (ref <0.60)

## 2013-10-08 LAB — TRIGLYCERIDES: Triglycerides: 87 mg/dL (ref <150)

## 2013-10-08 LAB — PHOSPHORUS: Phosphorus: 2.5 mg/dL (ref 2.3–4.6)

## 2013-10-09 ENCOUNTER — Other Ambulatory Visit (HOSPITAL_COMMUNITY): Payer: Self-pay

## 2013-10-09 LAB — TYPE AND SCREEN
ABO/RH(D): A NEG
ANTIBODY SCREEN: NEGATIVE
UNIT DIVISION: 0
UNIT DIVISION: 0
Unit division: 0
Unit division: 0

## 2013-10-09 LAB — CBC WITH DIFFERENTIAL/PLATELET
Basophils Absolute: 0 K/uL (ref 0.0–0.1)
Basophils Relative: 1 % (ref 0–1)
Eosinophils Absolute: 0.1 K/uL (ref 0.0–0.7)
Eosinophils Relative: 3 % (ref 0–5)
HCT: 24.1 % — ABNORMAL LOW (ref 39.0–52.0)
Hemoglobin: 7.9 g/dL — ABNORMAL LOW (ref 13.0–17.0)
Lymphocytes Relative: 20 % (ref 12–46)
Lymphs Abs: 0.7 K/uL (ref 0.7–4.0)
MCH: 28.6 pg (ref 26.0–34.0)
MCHC: 32.8 g/dL (ref 30.0–36.0)
MCV: 87.3 fL (ref 78.0–100.0)
Monocytes Absolute: 0.3 K/uL (ref 0.1–1.0)
Monocytes Relative: 7 % (ref 3–12)
Neutro Abs: 2.5 K/uL (ref 1.7–7.7)
Neutrophils Relative %: 69 % (ref 43–77)
Platelets: 163 K/uL (ref 150–400)
RBC: 2.76 MIL/uL — ABNORMAL LOW (ref 4.22–5.81)
RDW: 15 % (ref 11.5–15.5)
WBC: 3.5 K/uL — ABNORMAL LOW (ref 4.0–10.5)

## 2013-10-09 LAB — TRIGLYCERIDES: Triglycerides: 43 mg/dL (ref <150)

## 2013-10-09 LAB — COMPREHENSIVE METABOLIC PANEL WITH GFR
ALT: 11 U/L (ref 0–53)
AST: 12 U/L (ref 0–37)
Albumin: 2.1 g/dL — ABNORMAL LOW (ref 3.5–5.2)
Alkaline Phosphatase: 77 U/L (ref 39–117)
Anion gap: 15 (ref 5–15)
BUN: 94 mg/dL — ABNORMAL HIGH (ref 6–23)
CO2: 22 meq/L (ref 19–32)
Calcium: 11.9 mg/dL — ABNORMAL HIGH (ref 8.4–10.5)
Chloride: 101 meq/L (ref 96–112)
Creatinine, Ser: 7.44 mg/dL — ABNORMAL HIGH (ref 0.50–1.35)
GFR calc Af Amer: 8 mL/min — ABNORMAL LOW (ref >90)
GFR calc non Af Amer: 7 mL/min — ABNORMAL LOW (ref >90)
Glucose, Bld: 188 mg/dL — ABNORMAL HIGH (ref 70–99)
Potassium: 3.6 meq/L — ABNORMAL LOW (ref 3.7–5.3)
Sodium: 138 meq/L (ref 137–147)
Total Bilirubin: 0.4 mg/dL (ref 0.3–1.2)
Total Protein: 5.8 g/dL — ABNORMAL LOW (ref 6.0–8.3)

## 2013-10-09 LAB — PHOSPHORUS: Phosphorus: 3 mg/dL (ref 2.3–4.6)

## 2013-10-09 LAB — PREALBUMIN: Prealbumin: 20.4 mg/dL (ref 17.0–34.0)

## 2013-10-09 LAB — HEMOGLOBIN A1C
Hgb A1c MFr Bld: 7.4 % — ABNORMAL HIGH (ref <5.7)
Mean Plasma Glucose: 166 mg/dL — ABNORMAL HIGH (ref <117)

## 2013-10-09 LAB — MAGNESIUM: Magnesium: 1.7 mg/dL (ref 1.5–2.5)

## 2013-10-10 LAB — PREALBUMIN: Prealbumin: 21.5 mg/dL (ref 17.0–34.0)

## 2013-10-10 LAB — COMPREHENSIVE METABOLIC PANEL
ALBUMIN: 2.2 g/dL — AB (ref 3.5–5.2)
ALK PHOS: 89 U/L (ref 39–117)
ALT: 13 U/L (ref 0–53)
AST: 13 U/L (ref 0–37)
Anion gap: 16 — ABNORMAL HIGH (ref 5–15)
BILIRUBIN TOTAL: 0.3 mg/dL (ref 0.3–1.2)
BUN: 73 mg/dL — ABNORMAL HIGH (ref 6–23)
CHLORIDE: 99 meq/L (ref 96–112)
CO2: 24 mEq/L (ref 19–32)
Calcium: 11.7 mg/dL — ABNORMAL HIGH (ref 8.4–10.5)
Creatinine, Ser: 6.05 mg/dL — ABNORMAL HIGH (ref 0.50–1.35)
GFR calc Af Amer: 10 mL/min — ABNORMAL LOW (ref >90)
GFR calc non Af Amer: 9 mL/min — ABNORMAL LOW (ref >90)
GLUCOSE: 269 mg/dL — AB (ref 70–99)
POTASSIUM: 3.6 meq/L — AB (ref 3.7–5.3)
Sodium: 139 mEq/L (ref 137–147)
Total Protein: 6.3 g/dL (ref 6.0–8.3)

## 2013-10-10 LAB — DIFFERENTIAL
Basophils Absolute: 0 10*3/uL (ref 0.0–0.1)
Basophils Relative: 1 % (ref 0–1)
Eosinophils Absolute: 0.1 10*3/uL (ref 0.0–0.7)
Eosinophils Relative: 3 % (ref 0–5)
LYMPHS ABS: 0.6 10*3/uL — AB (ref 0.7–4.0)
LYMPHS PCT: 16 % (ref 12–46)
Monocytes Absolute: 0.2 10*3/uL (ref 0.1–1.0)
Monocytes Relative: 6 % (ref 3–12)
Neutro Abs: 2.7 10*3/uL (ref 1.7–7.7)
Neutrophils Relative %: 74 % (ref 43–77)

## 2013-10-10 LAB — MAGNESIUM: Magnesium: 1.8 mg/dL (ref 1.5–2.5)

## 2013-10-10 LAB — TRIGLYCERIDES: Triglycerides: 79 mg/dL (ref <150)

## 2013-10-10 LAB — PHOSPHORUS: Phosphorus: 2.5 mg/dL (ref 2.3–4.6)

## 2013-10-11 ENCOUNTER — Other Ambulatory Visit (HOSPITAL_COMMUNITY): Payer: Self-pay

## 2013-10-11 DIAGNOSIS — K560 Paralytic ileus: Secondary | ICD-10-CM

## 2013-10-11 LAB — COMPREHENSIVE METABOLIC PANEL
ALT: 13 U/L (ref 0–53)
ANION GAP: 19 — AB (ref 5–15)
AST: 13 U/L (ref 0–37)
Albumin: 2.3 g/dL — ABNORMAL LOW (ref 3.5–5.2)
Alkaline Phosphatase: 87 U/L (ref 39–117)
BUN: 89 mg/dL — ABNORMAL HIGH (ref 6–23)
CHLORIDE: 98 meq/L (ref 96–112)
CO2: 22 meq/L (ref 19–32)
CREATININE: 7.04 mg/dL — AB (ref 0.50–1.35)
Calcium: 12.1 mg/dL — ABNORMAL HIGH (ref 8.4–10.5)
GFR, EST AFRICAN AMERICAN: 8 mL/min — AB (ref >90)
GFR, EST NON AFRICAN AMERICAN: 7 mL/min — AB (ref >90)
GLUCOSE: 143 mg/dL — AB (ref 70–99)
Potassium: 4 mEq/L (ref 3.7–5.3)
Sodium: 139 mEq/L (ref 137–147)
Total Bilirubin: 0.4 mg/dL (ref 0.3–1.2)
Total Protein: 6.2 g/dL (ref 6.0–8.3)

## 2013-10-11 LAB — MAGNESIUM: MAGNESIUM: 1.8 mg/dL (ref 1.5–2.5)

## 2013-10-11 LAB — DIFFERENTIAL
BASOS ABS: 0 10*3/uL (ref 0.0–0.1)
Basophils Relative: 1 % (ref 0–1)
EOS PCT: 2 % (ref 0–5)
Eosinophils Absolute: 0.1 10*3/uL (ref 0.0–0.7)
Lymphocytes Relative: 13 % (ref 12–46)
Lymphs Abs: 0.6 10*3/uL — ABNORMAL LOW (ref 0.7–4.0)
Monocytes Absolute: 0.3 10*3/uL (ref 0.1–1.0)
Monocytes Relative: 7 % (ref 3–12)
NEUTROS PCT: 77 % (ref 43–77)
Neutro Abs: 3.4 10*3/uL (ref 1.7–7.7)

## 2013-10-11 LAB — HEMOGLOBIN: Hemoglobin: 8.2 g/dL — ABNORMAL LOW (ref 13.0–17.0)

## 2013-10-11 LAB — SEDIMENTATION RATE: Sed Rate: 50 mm/hr — ABNORMAL HIGH (ref 0–16)

## 2013-10-11 LAB — PHOSPHORUS: Phosphorus: 3.1 mg/dL (ref 2.3–4.6)

## 2013-10-11 LAB — TRIGLYCERIDES: Triglycerides: 44 mg/dL (ref <150)

## 2013-10-11 NOTE — Progress Notes (Signed)
Daily Rounding Note  10/11/2013, 12:47 PM  LOS: 30 days   SUBJECTIVE:       Asked to reevaluate the pt's ongoing ileus.  Staff on [facility] unable to make any progress with ileus despite NG and rectal tubes, erythromycin, Miralax.  Not on Reglan.  Somewhat improved control of blood sugars. Still receiving prn Oxycodone 5/325: 10 mg on 7/22, 5 mg on 7/23 and RN unable to pull up previous days prn dosing.   See original GI consult of 09/24/13.   On 7/13 the KUB showed resolution of ileus and NGT and flexiseal were discontinued and pt started on clears.  However his abdominal distention recurred.  accelerated.  NGT and Flexiseal now back in place.  TNA ongoing. CT scan repeated 7/22 and showed decreased colonic distention with change in calibre at splenic flexure.  No obstruction or masses.  KUB of 7/24 shows stable to mildly improved gas-filled, patulous bowel loops of bowel.   Pt unable to ambulate/mobilize due to osteomyelitis and pain in his heel.  Activity amounts to getting OOB to lounge chair.   Pt denies pain.  Just feels distended.  Some nausea. Stool and NGT output light brown, not bloody  OBJECTIVE:         Vital signs in last 24 hours:     pulse 77 Temp 97.6 resp 18  BP 123/79   General: ill looking, obese.    Heart: RRR Chest: clear bil Abdomen: distended but not tense, in fact quite soft.   Extremities: + LE edema.  Right foot in brace.  Neuro/Psych:  Oriented x 3.  No tremor.  Strength not tested.   Meds Cefepime TNA novolog insulin Aranesp Erythromycin base 250 mg via tube QID  Furosemide levemir insulin Metoprolol Miralax 17 gram daily Protonix 40 mg BID via tube.   PRN: Oxycodone. Etc.     Lab Results:  Recent Labs  10/09/13 0355 10/11/13 0605  WBC 3.5*  --   HGB 7.9* 8.2*  HCT 24.1*  --   PLT 163  --    BMET  Recent Labs  10/09/13 0355 10/10/13 0838 10/11/13 0605  NA 138 139  139  K 3.6* 3.6* 4.0  CL 101 99 98  CO2 22 24 22   GLUCOSE 188* 269* 143*  BUN 94* 73* 89*  CREATININE 7.44* 6.05* 7.04*  CALCIUM 11.9* 11.7* 12.1*   LFT  Recent Labs  10/09/13 0355 10/10/13 0838 10/11/13 0605  PROT 5.8* 6.3 6.2  ALBUMIN 2.1* 2.2* 2.3*  AST 12 13 13   ALT 11 13 13   ALKPHOS 77 89 87  BILITOT 0.4 0.3 0.4    Studies/Results: Ct Abdomen Pelvis Wo Contrast 10/09/2013     COMPARISON:  09/23/2013  FINDINGS: There is dependent atelectasis at both lung bases. There is a small left and a small to moderate right pleural effusion, both increased when compared to the prior study.  Liver is normal. There are numerous gallstones, stable. Spleen and pancreas are normal. Adrenal glands are normal. Both kidneys show significant atrophy. Stable probable cyst measuring about 1.5 cm midpole right kidney medially.  Calcification of the abdominal aorta. Bladder decompressed. Reproductive organs normal. No free fluid.  There is gas throughout the large bowel, and the colon is mildly distended. Transverse colon measures up to 9 cm in diameter. Proximal: Measures up to 6.4 cm. There is transition in caliber at the splenic flexure, without evidence of focal obstructing lesion. Sigmoid colon  is also relatively decompressed.  No acute osseous abnormalities. Fusion of L3 and L4 with endplate destruction and posterior fixation, stable.  IMPRESSION: The colon shows persistent but decreased distension. Although there is a caliber change in the region of the splenic flexure, the findings are felt to be due to colonic ileus and no focal mass obstructing lesion is seen or suspected.  Bilateral pleural effusions.  Stable cholelithiasis.   Electronically Signed   By: Esperanza Heir M.D.   On: 10/09/2013 15:44   Dg Abd Portable 1v 10/11/2013   CLINICAL DATA:  68 year old male NG tube placement. Initial encounter.  EXAM: PORTABLE ABDOMEN - 1 VIEW  COMPARISON:  CT Abdomen and Pelvis 10/09/2013.  FINDINGS: Two  portable views of the abdomen at 0617 hrs. Enteric tube in place, tip at the level of the gastric antrum. Side hole not clearly visible but likely at the mid gastric body. Continued gas-filled patulous bowel loops in the abdomen, stable to mildly regressed from comparison. Lower lumbar fusion hardware. Stable visualized osseous structures.  IMPRESSION: Enteric tube tip at the level of the gastric antrum.  Stable to mildly regressed featureless gas-filled bowel loops in the abdomen.   Electronically Signed   By: Augusto Gamble M.D.   On: 10/11/2013 08:00    ASSESMENT:   *  Colonic ileus  *  ESRD  *  IDDM. Sugars labile.   *  Normocytic anemia. Transfused 2 units PRBCs 10/05/13.    PLAN   *  c diff PCR on remote chance that C Diff could be driving the distention.  *  I discontinued the Oxycodone.  *  Reglan in use in past and of no help.  Not sure Erythromycin is helping either.     Jennye Moccasin  10/11/2013, 12:47 PM Pager: [phone]  Agree w/ Ms. Clarita Leber. We will f/u Monday  Iva Boop, MD, [facility] [phone] (pager) 10/11/2013 5:31 PM

## 2013-10-12 LAB — MAGNESIUM: Magnesium: 1.9 mg/dL (ref 1.5–2.5)

## 2013-10-12 LAB — COMPREHENSIVE METABOLIC PANEL
ALBUMIN: 2.1 g/dL — AB (ref 3.5–5.2)
ALT: 14 U/L (ref 0–53)
ANION GAP: 17 — AB (ref 5–15)
AST: 23 U/L (ref 0–37)
Alkaline Phosphatase: 86 U/L (ref 39–117)
BILIRUBIN TOTAL: 0.3 mg/dL (ref 0.3–1.2)
BUN: 66 mg/dL — ABNORMAL HIGH (ref 6–23)
CO2: 21 meq/L (ref 19–32)
Calcium: 11.1 mg/dL — ABNORMAL HIGH (ref 8.4–10.5)
Chloride: 98 mEq/L (ref 96–112)
Creatinine, Ser: 5.49 mg/dL — ABNORMAL HIGH (ref 0.50–1.35)
GFR calc Af Amer: 11 mL/min — ABNORMAL LOW (ref >90)
GFR calc non Af Amer: 10 mL/min — ABNORMAL LOW (ref >90)
GLUCOSE: 212 mg/dL — AB (ref 70–99)
Potassium: 4.3 mEq/L (ref 3.7–5.3)
Sodium: 136 mEq/L — ABNORMAL LOW (ref 137–147)
Total Protein: 5.9 g/dL — ABNORMAL LOW (ref 6.0–8.3)

## 2013-10-12 LAB — PREALBUMIN: PREALBUMIN: 22.5 mg/dL (ref 17.0–34.0)

## 2013-10-12 LAB — TRIGLYCERIDES: TRIGLYCERIDES: 82 mg/dL (ref <150)

## 2013-10-12 LAB — CLOSTRIDIUM DIFFICILE BY PCR: CDIFFPCR: NEGATIVE

## 2013-10-12 LAB — PHOSPHORUS: PHOSPHORUS: 2.2 mg/dL — AB (ref 2.3–4.6)

## 2013-10-13 LAB — COMPREHENSIVE METABOLIC PANEL
ALBUMIN: 2.3 g/dL — AB (ref 3.5–5.2)
ALT: 13 U/L (ref 0–53)
ANION GAP: 18 — AB (ref 5–15)
AST: 15 U/L (ref 0–37)
Alkaline Phosphatase: 87 U/L (ref 39–117)
BUN: 87 mg/dL — AB (ref 6–23)
CO2: 24 mEq/L (ref 19–32)
CREATININE: 6.84 mg/dL — AB (ref 0.50–1.35)
Calcium: 11.7 mg/dL — ABNORMAL HIGH (ref 8.4–10.5)
Chloride: 99 mEq/L (ref 96–112)
GFR calc Af Amer: 9 mL/min — ABNORMAL LOW (ref >90)
GFR calc non Af Amer: 7 mL/min — ABNORMAL LOW (ref >90)
Glucose, Bld: 109 mg/dL — ABNORMAL HIGH (ref 70–99)
Potassium: 4.2 mEq/L (ref 3.7–5.3)
Sodium: 141 mEq/L (ref 137–147)
TOTAL PROTEIN: 6.1 g/dL (ref 6.0–8.3)
Total Bilirubin: 0.3 mg/dL (ref 0.3–1.2)

## 2013-10-13 LAB — CBC WITH DIFFERENTIAL/PLATELET
BASOS ABS: 0 10*3/uL (ref 0.0–0.1)
Basophils Relative: 0 % (ref 0–1)
EOS ABS: 0.1 10*3/uL (ref 0.0–0.7)
EOS PCT: 2 % (ref 0–5)
HCT: 24.2 % — ABNORMAL LOW (ref 39.0–52.0)
Hemoglobin: 7.6 g/dL — ABNORMAL LOW (ref 13.0–17.0)
Lymphocytes Relative: 16 % (ref 12–46)
Lymphs Abs: 0.7 10*3/uL (ref 0.7–4.0)
MCH: 28 pg (ref 26.0–34.0)
MCHC: 31.4 g/dL (ref 30.0–36.0)
MCV: 89.3 fL (ref 78.0–100.0)
Monocytes Absolute: 0.3 10*3/uL (ref 0.1–1.0)
Monocytes Relative: 6 % (ref 3–12)
Neutro Abs: 3.4 10*3/uL (ref 1.7–7.7)
Neutrophils Relative %: 76 % (ref 43–77)
PLATELETS: 168 10*3/uL (ref 150–400)
RBC: 2.71 MIL/uL — ABNORMAL LOW (ref 4.22–5.81)
RDW: 15.1 % (ref 11.5–15.5)
WBC: 4.5 10*3/uL (ref 4.0–10.5)

## 2013-10-13 LAB — PROCALCITONIN: Procalcitonin: 3.68 ng/mL

## 2013-10-13 LAB — MAGNESIUM: Magnesium: 2 mg/dL (ref 1.5–2.5)

## 2013-10-13 LAB — TRIGLYCERIDES: Triglycerides: 41 mg/dL (ref <150)

## 2013-10-13 LAB — PHOSPHORUS: Phosphorus: 3 mg/dL (ref 2.3–4.6)

## 2013-10-13 LAB — C-REACTIVE PROTEIN: CRP: 7.4 mg/dL — AB (ref <0.60)

## 2013-10-13 LAB — PREALBUMIN: Prealbumin: 23.1 mg/dL (ref 17.0–34.0)

## 2013-10-13 LAB — SEDIMENTATION RATE: SED RATE: 60 mm/h — AB (ref 0–16)

## 2013-10-14 ENCOUNTER — Other Ambulatory Visit (HOSPITAL_COMMUNITY): Payer: Self-pay

## 2013-10-14 LAB — CBC WITH DIFFERENTIAL/PLATELET
BASOS ABS: 0 10*3/uL (ref 0.0–0.1)
Basophils Relative: 1 % (ref 0–1)
Eosinophils Absolute: 0.1 10*3/uL (ref 0.0–0.7)
Eosinophils Relative: 2 % (ref 0–5)
HCT: 22.7 % — ABNORMAL LOW (ref 39.0–52.0)
HEMOGLOBIN: 7.1 g/dL — AB (ref 13.0–17.0)
LYMPHS PCT: 12 % (ref 12–46)
Lymphs Abs: 0.5 10*3/uL — ABNORMAL LOW (ref 0.7–4.0)
MCH: 27.6 pg (ref 26.0–34.0)
MCHC: 31.3 g/dL (ref 30.0–36.0)
MCV: 88.3 fL (ref 78.0–100.0)
MONO ABS: 0.4 10*3/uL (ref 0.1–1.0)
MONOS PCT: 9 % (ref 3–12)
NEUTROS ABS: 3.4 10*3/uL (ref 1.7–7.7)
Neutrophils Relative %: 76 % (ref 43–77)
Platelets: 169 10*3/uL (ref 150–400)
RBC: 2.57 MIL/uL — ABNORMAL LOW (ref 4.22–5.81)
RDW: 15.5 % (ref 11.5–15.5)
WBC: 4.4 10*3/uL (ref 4.0–10.5)

## 2013-10-14 LAB — PHOSPHORUS: PHOSPHORUS: 3.5 mg/dL (ref 2.3–4.6)

## 2013-10-14 LAB — TRIGLYCERIDES: Triglycerides: 60 mg/dL (ref <150)

## 2013-10-14 LAB — PREALBUMIN: Prealbumin: 22.6 mg/dL (ref 17.0–34.0)

## 2013-10-14 LAB — MAGNESIUM: Magnesium: 2.1 mg/dL (ref 1.5–2.5)

## 2013-10-14 LAB — COMPREHENSIVE METABOLIC PANEL
ALT: 17 U/L (ref 0–53)
ANION GAP: 20 — AB (ref 5–15)
AST: 14 U/L (ref 0–37)
Albumin: 2.1 g/dL — ABNORMAL LOW (ref 3.5–5.2)
Alkaline Phosphatase: 92 U/L (ref 39–117)
BILIRUBIN TOTAL: 0.3 mg/dL (ref 0.3–1.2)
BUN: 108 mg/dL — AB (ref 6–23)
CALCIUM: 11.6 mg/dL — AB (ref 8.4–10.5)
CHLORIDE: 97 meq/L (ref 96–112)
CO2: 21 meq/L (ref 19–32)
Creatinine, Ser: 8.02 mg/dL — ABNORMAL HIGH (ref 0.50–1.35)
GFR calc non Af Amer: 6 mL/min — ABNORMAL LOW (ref >90)
GFR, EST AFRICAN AMERICAN: 7 mL/min — AB (ref >90)
GLUCOSE: 288 mg/dL — AB (ref 70–99)
Potassium: 4.5 mEq/L (ref 3.7–5.3)
Sodium: 138 mEq/L (ref 137–147)
Total Protein: 5.9 g/dL — ABNORMAL LOW (ref 6.0–8.3)

## 2013-10-14 LAB — MRSA PCR SCREENING: MRSA by PCR: NEGATIVE

## 2013-10-15 LAB — COMPREHENSIVE METABOLIC PANEL
ALBUMIN: 2 g/dL — AB (ref 3.5–5.2)
ALK PHOS: 103 U/L (ref 39–117)
ALT: 34 U/L (ref 0–53)
ANION GAP: 15 (ref 5–15)
AST: 22 U/L (ref 0–37)
BILIRUBIN TOTAL: 0.2 mg/dL — AB (ref 0.3–1.2)
BUN: 89 mg/dL — AB (ref 6–23)
CHLORIDE: 99 meq/L (ref 96–112)
CO2: 23 mEq/L (ref 19–32)
Calcium: 11 mg/dL — ABNORMAL HIGH (ref 8.4–10.5)
Creatinine, Ser: 6.92 mg/dL — ABNORMAL HIGH (ref 0.50–1.35)
GFR calc Af Amer: 9 mL/min — ABNORMAL LOW (ref >90)
GFR calc non Af Amer: 7 mL/min — ABNORMAL LOW (ref >90)
Glucose, Bld: 222 mg/dL — ABNORMAL HIGH (ref 70–99)
POTASSIUM: 3.9 meq/L (ref 3.7–5.3)
SODIUM: 137 meq/L (ref 137–147)
Total Protein: 5.7 g/dL — ABNORMAL LOW (ref 6.0–8.3)

## 2013-10-15 LAB — DIFFERENTIAL
Basophils Absolute: 0 10*3/uL (ref 0.0–0.1)
Basophils Relative: 1 % (ref 0–1)
EOS ABS: 0.1 10*3/uL (ref 0.0–0.7)
Eosinophils Relative: 2 % (ref 0–5)
LYMPHS PCT: 15 % (ref 12–46)
Lymphs Abs: 0.5 10*3/uL — ABNORMAL LOW (ref 0.7–4.0)
Monocytes Absolute: 0.4 10*3/uL (ref 0.1–1.0)
Monocytes Relative: 12 % (ref 3–12)
NEUTROS ABS: 2.5 10*3/uL (ref 1.7–7.7)
Neutrophils Relative %: 70 % (ref 43–77)

## 2013-10-15 LAB — PREALBUMIN: Prealbumin: 21.6 mg/dL (ref 17.0–34.0)

## 2013-10-15 LAB — PHOSPHORUS: Phosphorus: 2.9 mg/dL (ref 2.3–4.6)

## 2013-10-15 LAB — MAGNESIUM: Magnesium: 2.1 mg/dL (ref 1.5–2.5)

## 2013-10-15 LAB — TRIGLYCERIDES: Triglycerides: 150 mg/dL — ABNORMAL HIGH (ref <150)

## 2013-10-19 DEATH — deceased

## 2015-08-19 IMAGING — US US RENAL
1 series · 13 of 25 positions shown · non-contrast
Comparison: CT abdomen and pelvis 09/23/2013

CLINICAL DATA: Follow-up from CT. Bilateral renal lesions.
Hemodialysis for 20+ years.

EXAM:
RENAL/URINARY TRACT ULTRASOUND COMPLETE

[Series 1: us renal · 0.28mm/px · 40 acquisitions, 13 frames shown]
[im 1/40]
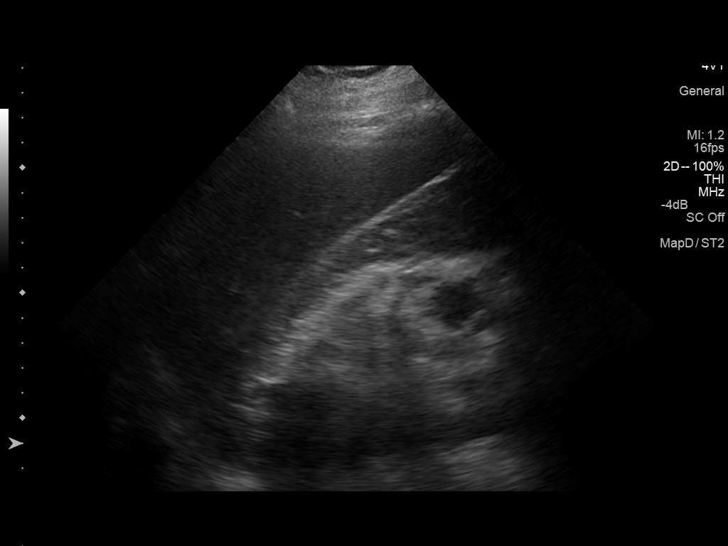
[im 4/40]
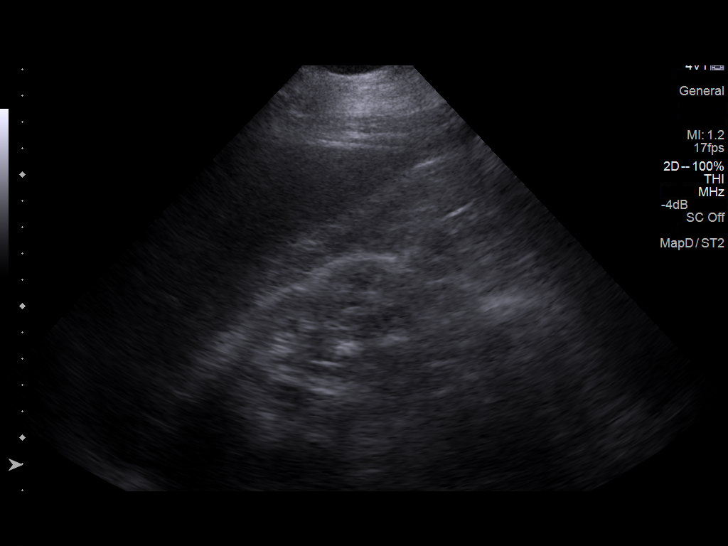
[im 7/40]
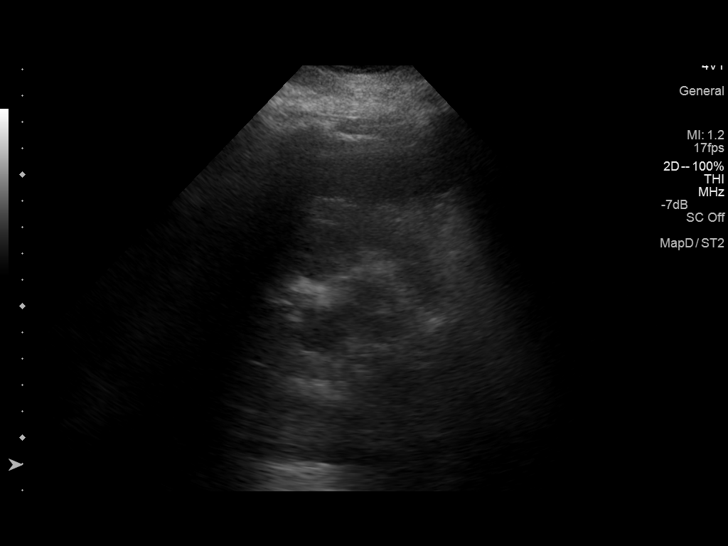
[im 10/40]
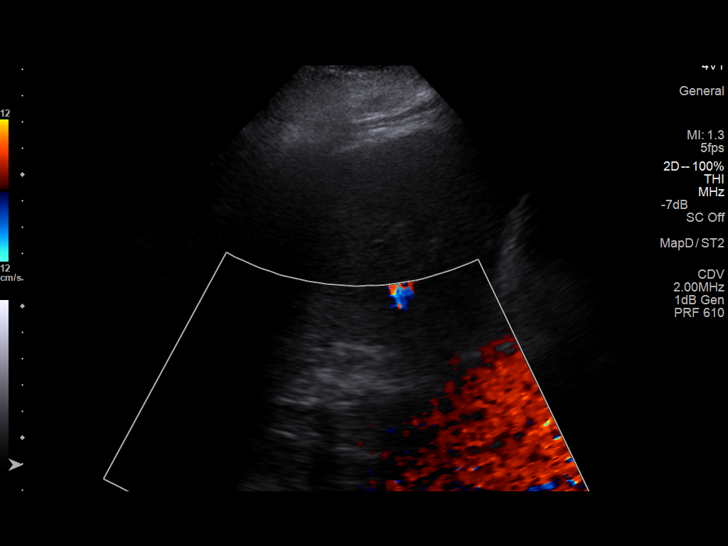
[im 14/40]
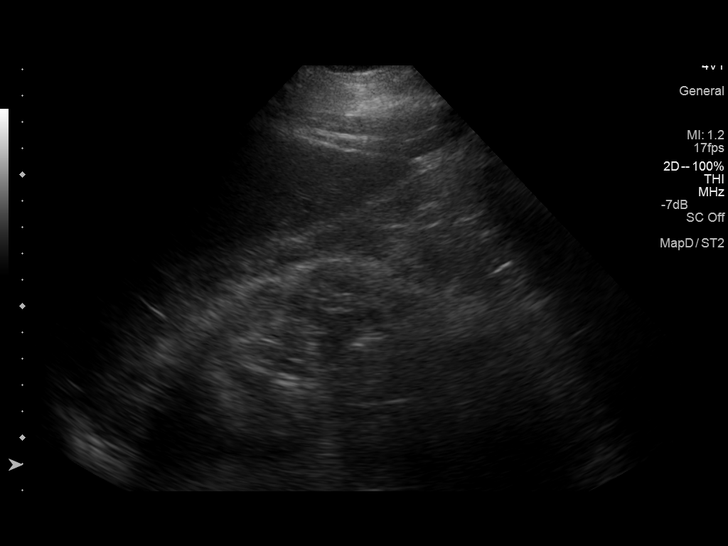
[im 17/40]
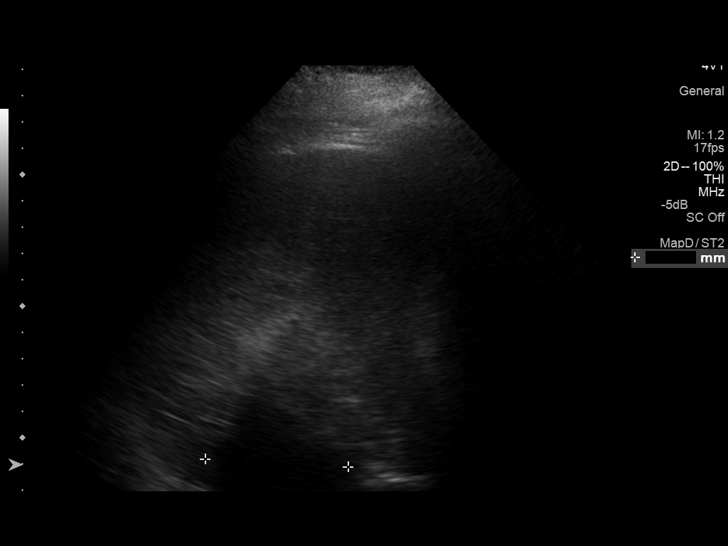
[im 20/40]
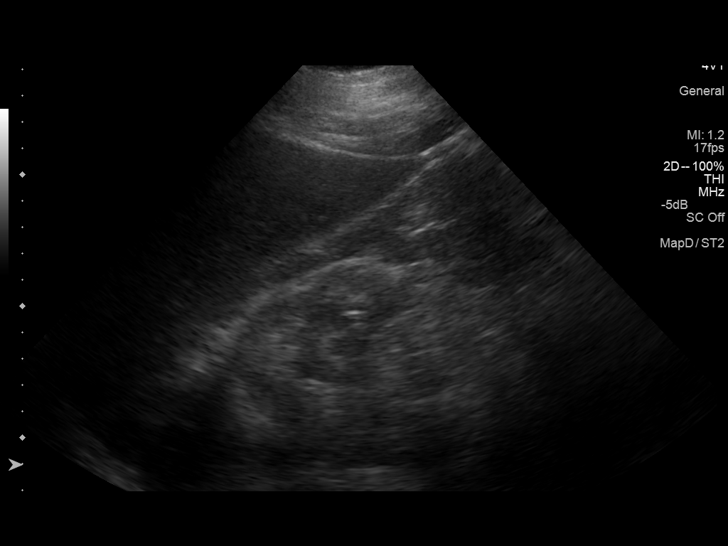
[im 23/40]
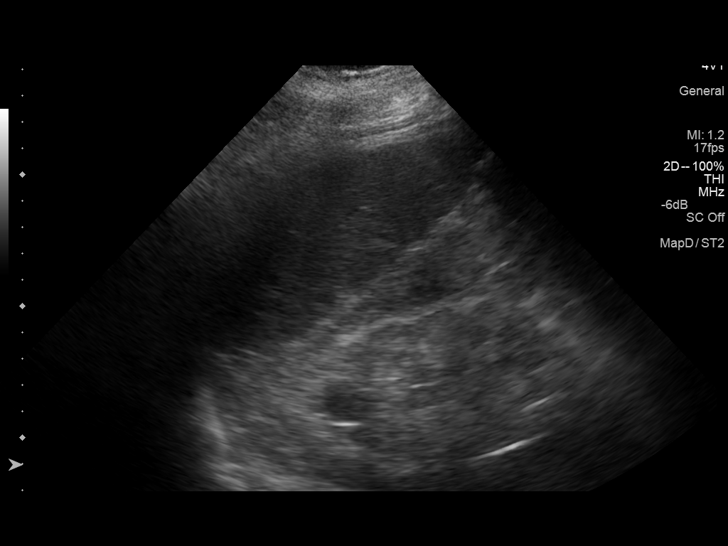
[im 27/40]
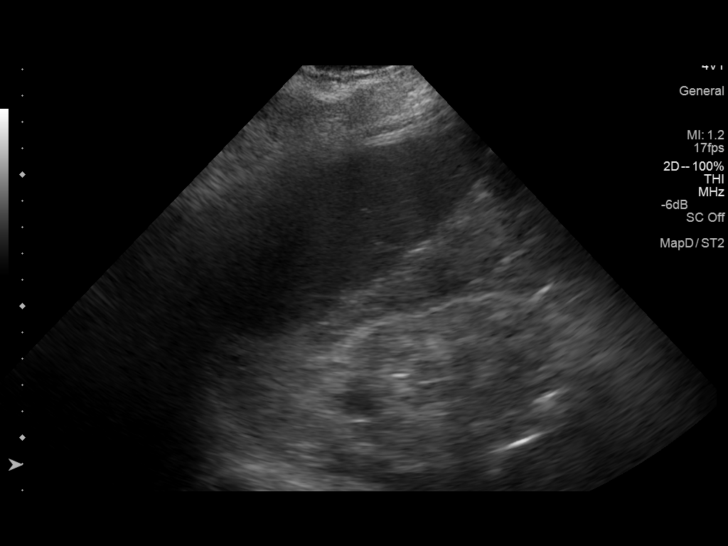
[im 30/40]
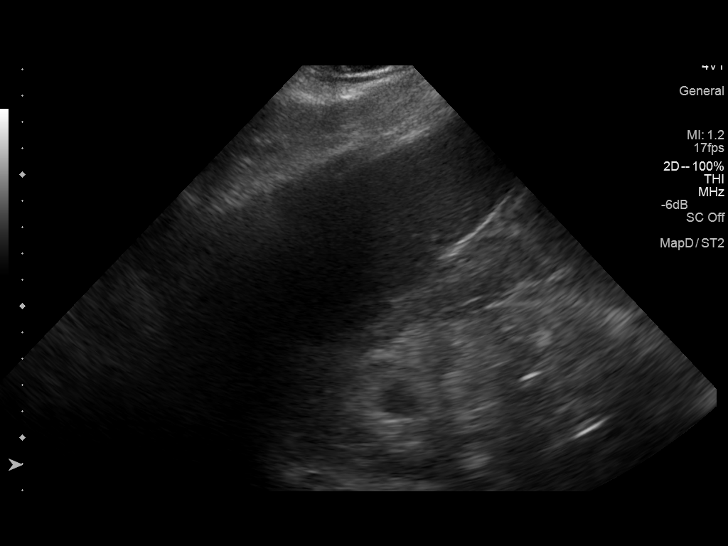
[im 33/40]
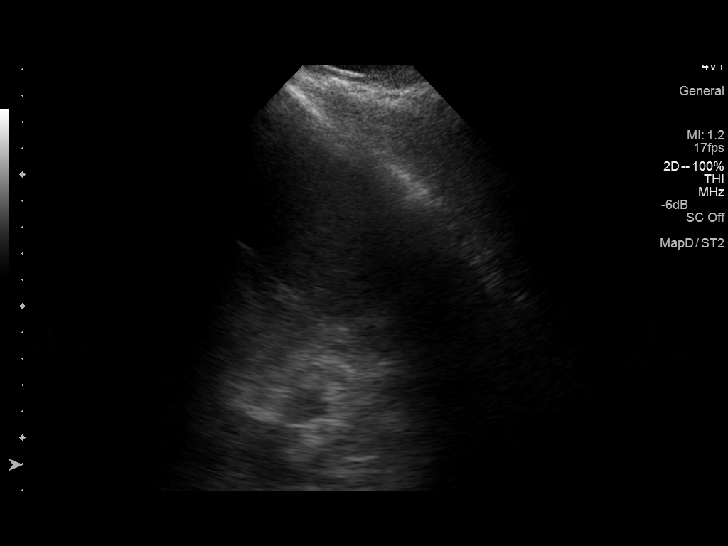
[im 36/40]
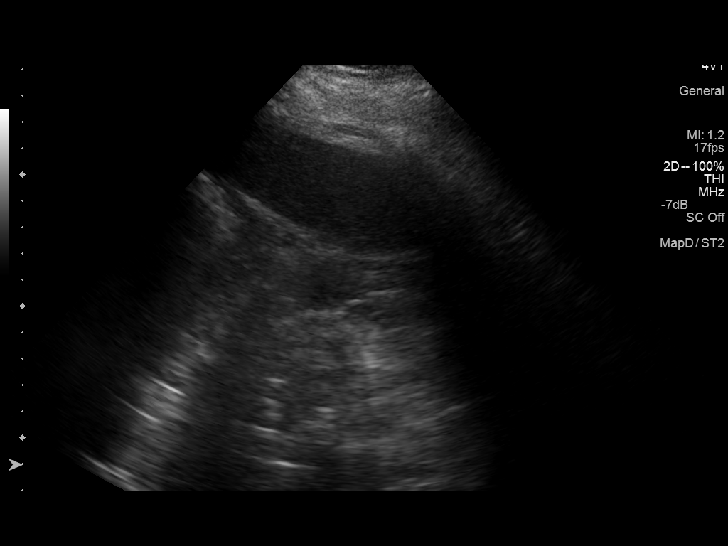
[im 40/40]
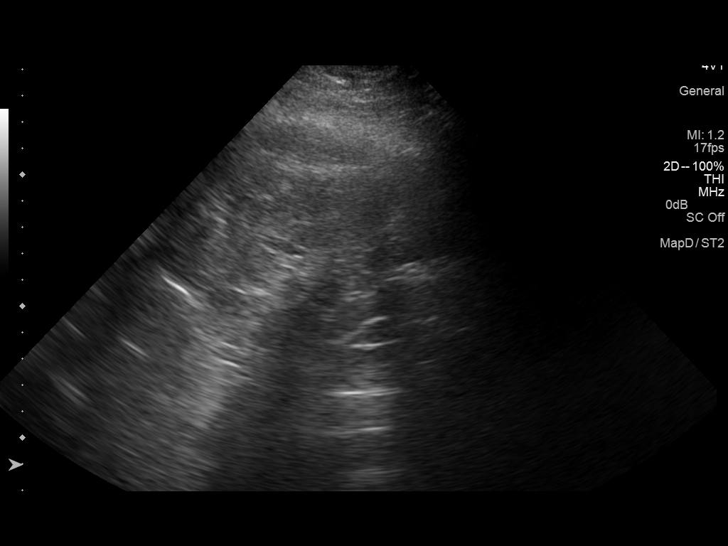

[13 of 25 positions shown; findings below may reference images not displayed]

FINDINGS: Right Kidney:

Length: 11.3 cm.. Diffuse parenchymal atrophy with heterogeneous
increased parenchymal echotexture consistent with medical renal
disease. A a small a cystic appearing structure is demonstrated in
the upper pole measuring about 1.6 cm maximal diameter. This
corresponds to the lesion on CT. An additional midpole lesion
measuring 2 cm diameter is identified. Penetration of these lesions
is limited due to the increased parenchymal echotexture with these
appear to represent cysts. There is a amorphous poorly defined area
measured by the technologist in the right suprarenal region
measuring about 5 cm diameter. No abnormality is identified in the
CT scan to correspond to this lesion and is likely represents
artifact.

Left Kidney:

Length: 8.3 cm. Heterogeneous increased parenchymal echotexture
diffusely with diffuse parenchymal atrophy consistent with medical
renal disease. Small low-attenuation lesion in the upper pole
measuring 12 mm diameter, corresponding to the lesion seen at CT.
Characterization is difficult due to echogenic appearance of the
renal parenchyma but this appears to represent a cyst.

Bladder:

Bladder is non visualized. No urine output due to dialysis dependent
renal failure.
IMPRESSION: Bilateral renal atrophy with increased echotexture consistent with
chronic medical renal disease. Low-attenuation lesions in the
kidneys corresponding to lesion seen on the prior CT consistent with
cyst.

## 2015-08-21 IMAGING — CR DG ABD PORTABLE 1V
1 series · 1 of 1 positions shown · non-contrast
Comparison: 09/22/2013

CLINICAL DATA: Ileus

EXAM:
PORTABLE ABDOMEN - 1 VIEW

[AP]
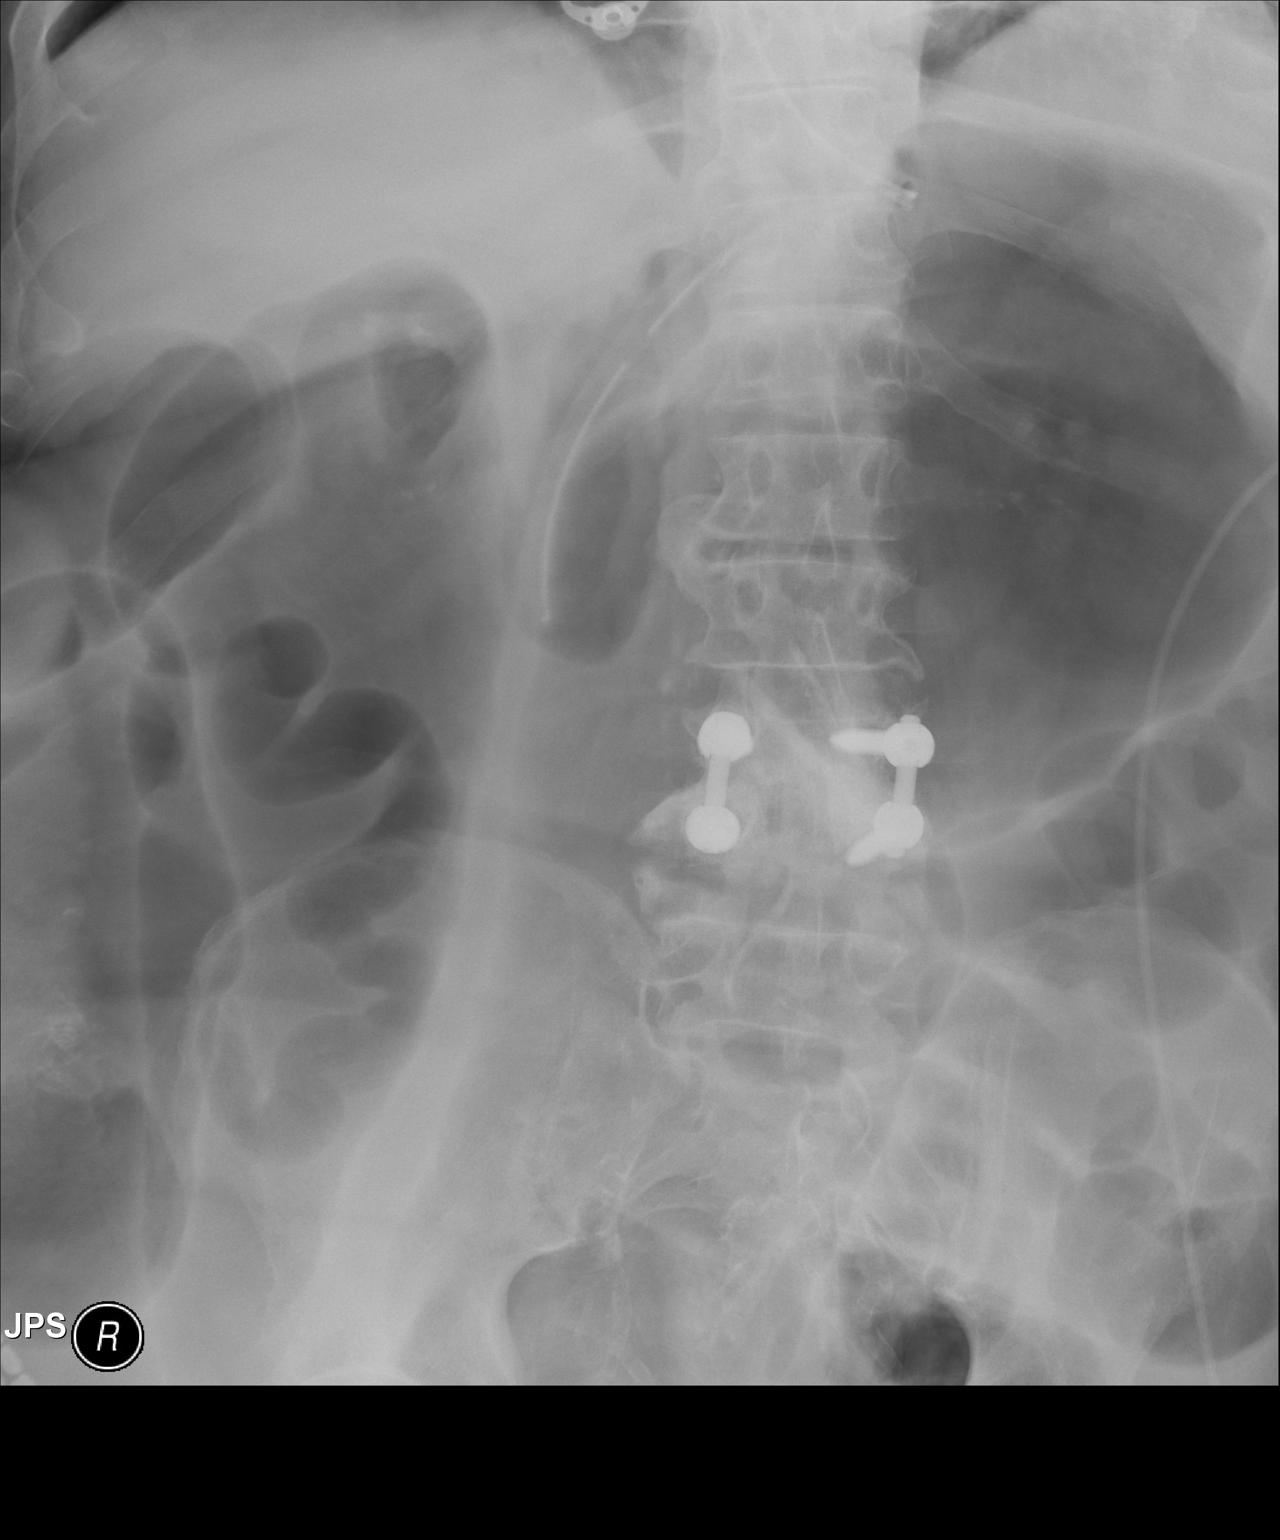

[1 of 1 positions shown; findings below may reference images not displayed]

FINDINGS: NG tube in the stomach.

Moderate diffuse colonic dilatation is similar to the prior study.
Gas-filled small bowel loops also are mildly distended.

Lumbar fusion with pedicle screws at L4-5 unchanged.
IMPRESSION: Moderate ileus unchanged.

## 2015-08-26 IMAGING — CR DG ABD PORTABLE 1V
1 series · 1 of 1 positions shown · non-contrast
Comparison: 09/28/2013.

CLINICAL DATA: NG tube.  Follow-up ileus.

EXAM:
PORTABLE ABDOMEN - 1 VIEW

[AP]
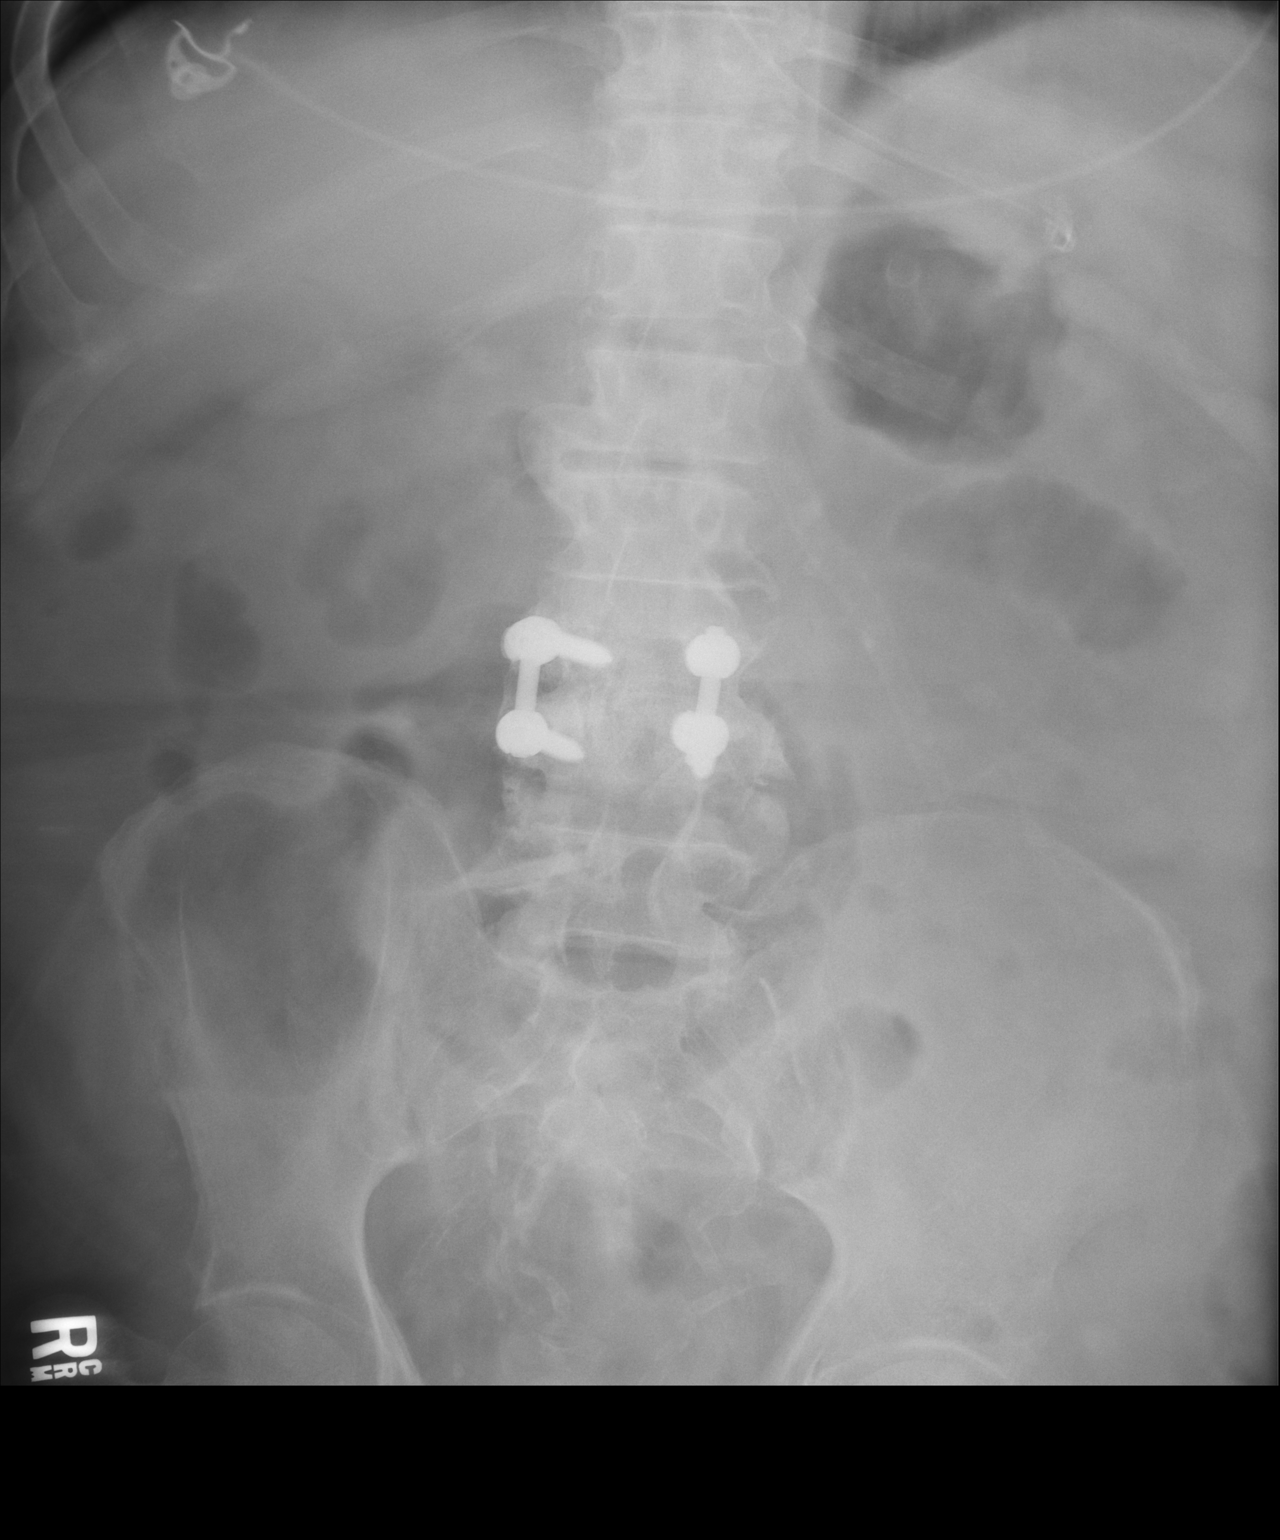

[1 of 1 positions shown; findings below may reference images not displayed]

FINDINGS: NG tube tip projects in the mid stomach, unchanged. Bowel gas
pattern is unremarkable. No evidence of obstruction or significant
adynamic ileus. Soft tissues are unremarkable other than vascular
calcifications. Degenerative changes of the lumbar spine and
postsurgical changes from L3-L4 fusion are stable.
IMPRESSION: No acute findings. No evidence of obstruction or significant
adynamic ileus. NG tube is stable and well positioned.

## 2015-08-28 IMAGING — CR DG ANKLE PORT 2V*R*
2 series · 2 of 2 positions shown · non-contrast
Comparison: None

CLINICAL DATA: Heel wound for 5 weeks, history diabetes,
hypertension

EXAM:
PORTABLE RIGHT ANKLE - 2 VIEW

[AP]
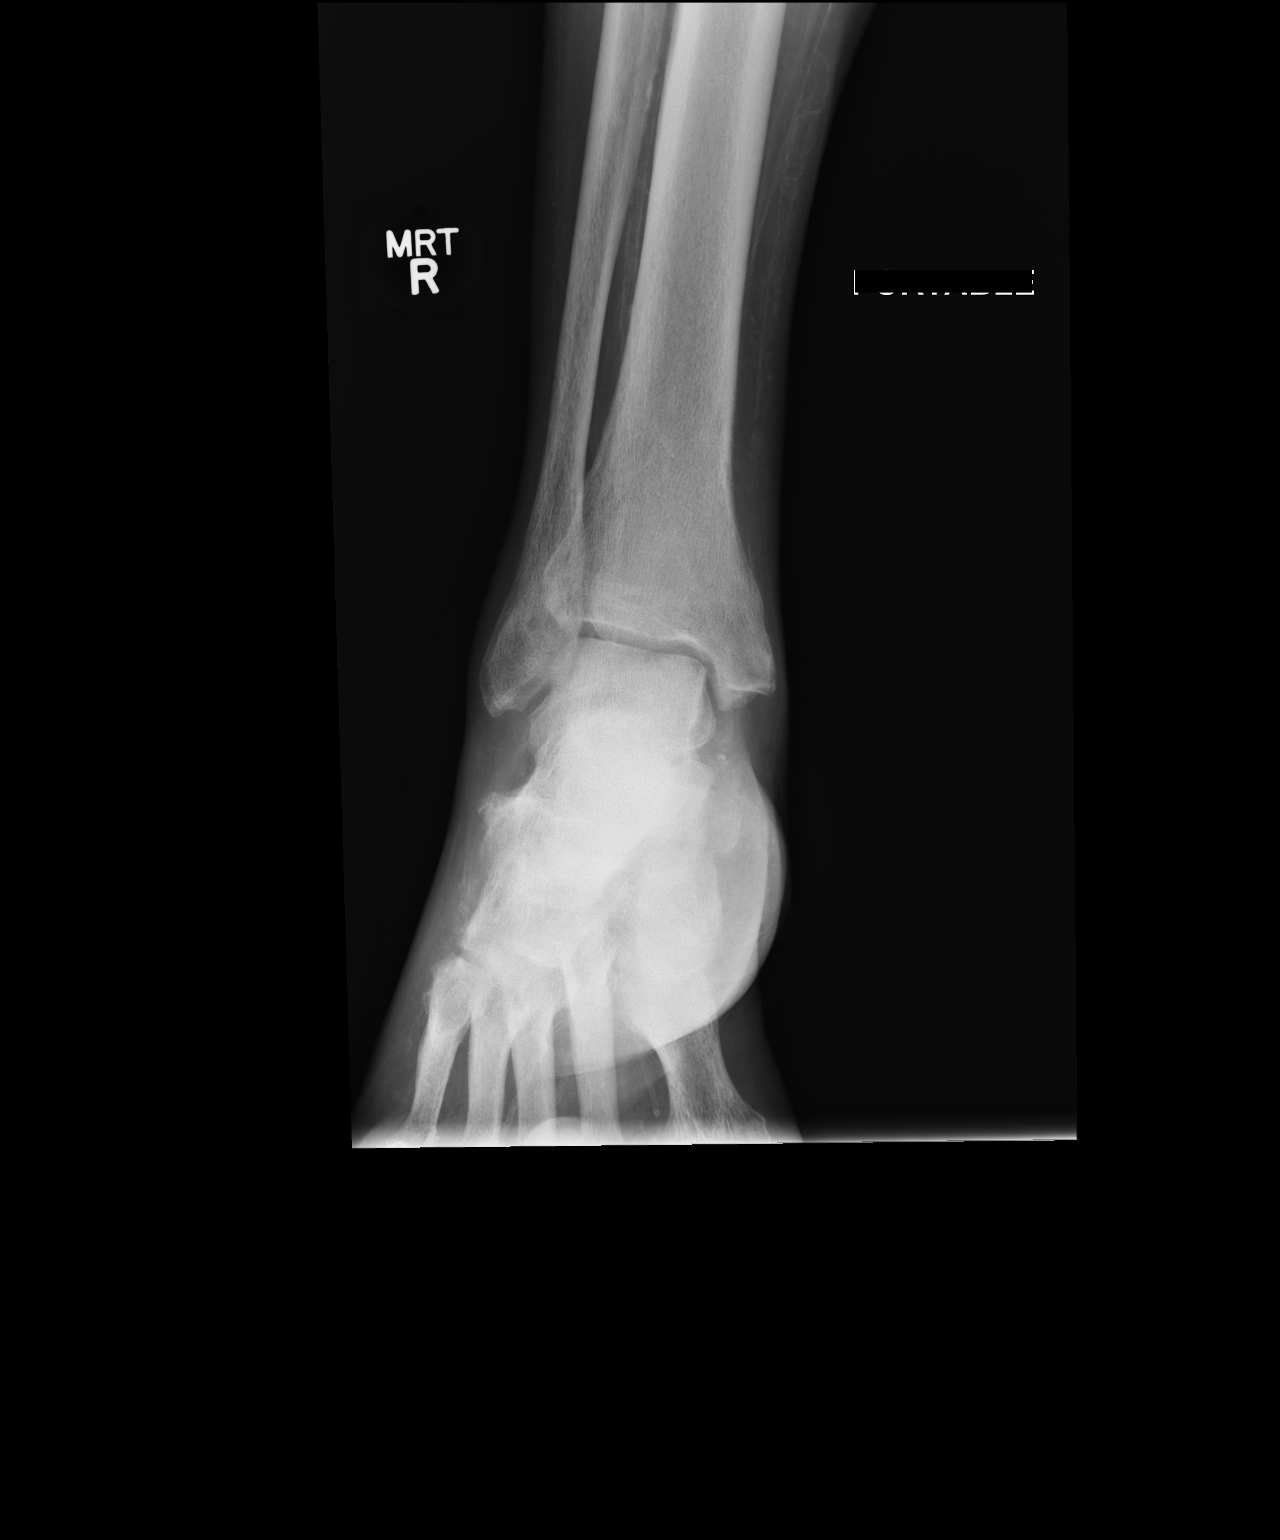

[lateral]
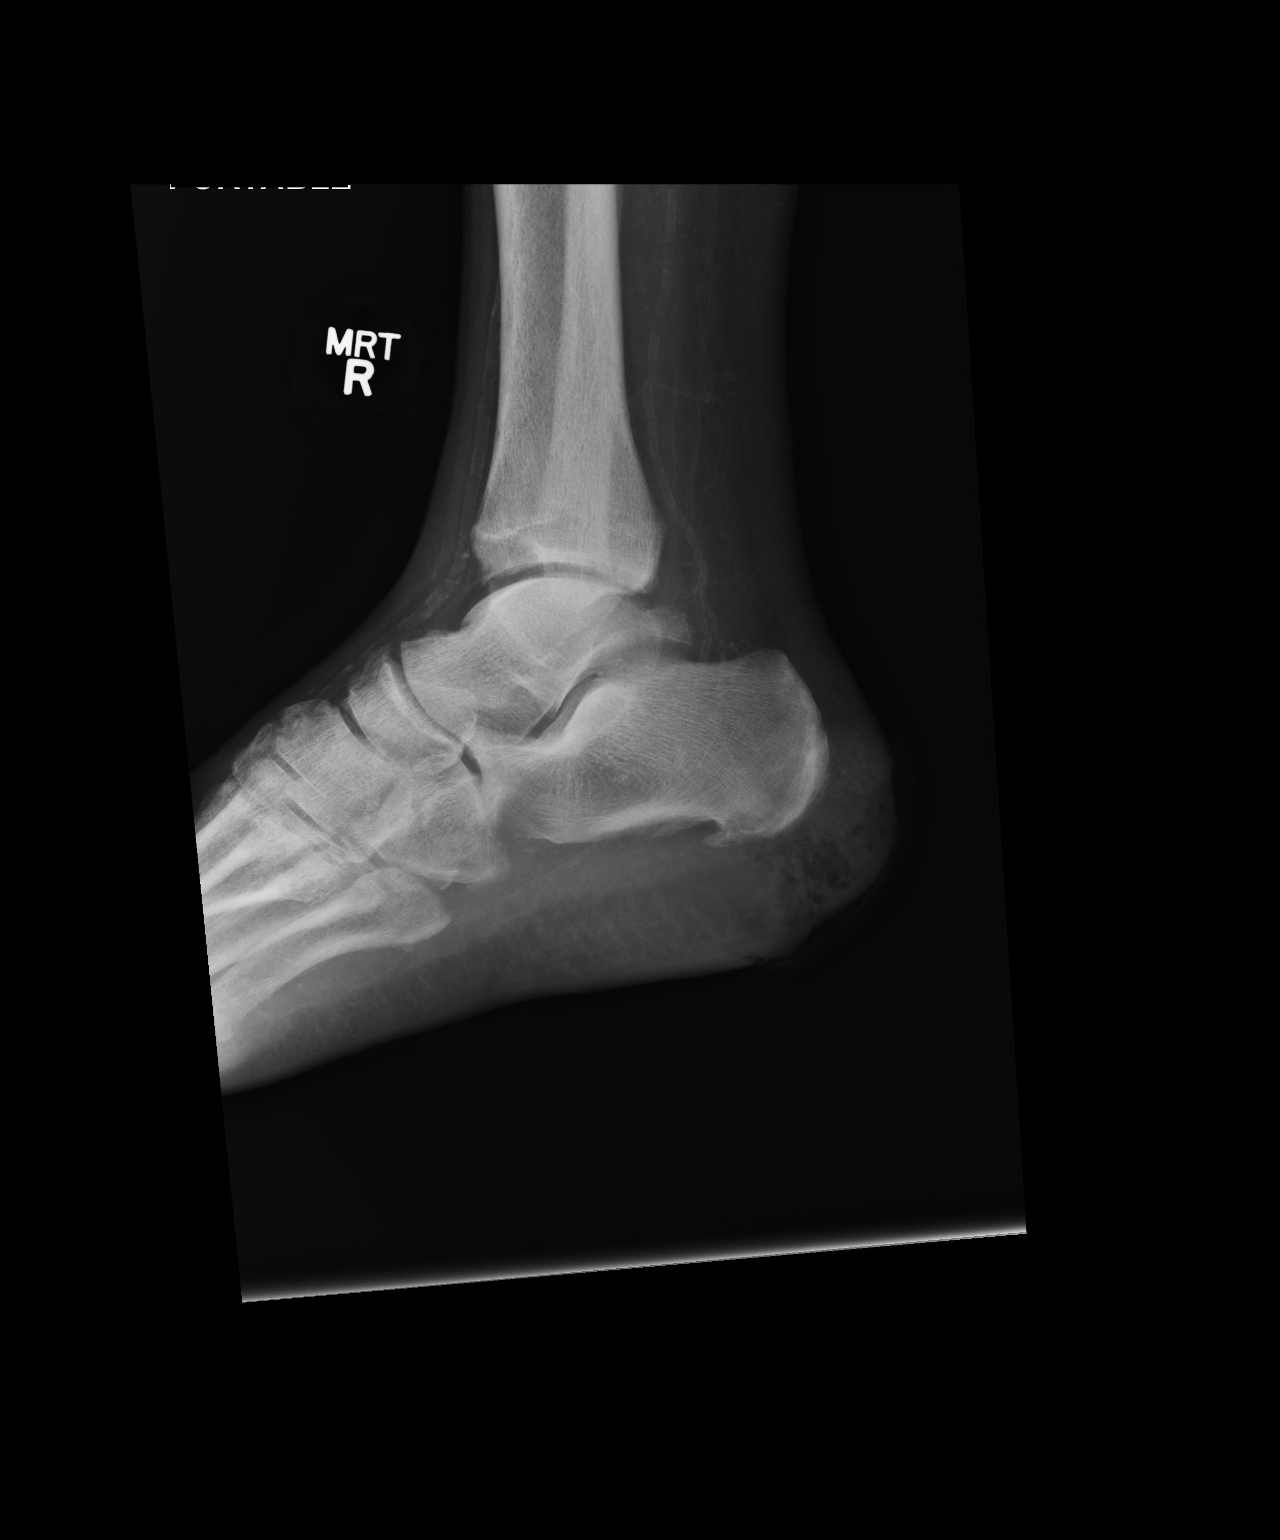

[2 of 2 positions shown; findings below may reference images not displayed]

FINDINGS: Soft tissue gas and swelling identified at the posterior heel
compatible with soft tissue infection and wound.

Bones appear demineralized.

Joint spaces preserved.

Loss of cortical definition is identified at the plantar aspect of
the calcaneus highly suspicious for osteomyelitis.

No acute fracture or dislocation.

No additional bone destruction.

Scattered small vessel vascular calcification.
IMPRESSION: Bone destruction at the plantar aspect of the calcaneus highly
suspicious for osteomyelitis.
